# Patient Record
Sex: Female | Born: 1970 | Race: White | Hispanic: No | Marital: Married | Smoking: Never smoker
Health system: Southern US, Community
[De-identification: ages and names within clinical notes are randomized; demographics above are authoritative.]

## PROBLEM LIST (undated history)

## (undated) DIAGNOSIS — M3500 Sicca syndrome, unspecified: Secondary | ICD-10-CM

## (undated) DIAGNOSIS — R197 Diarrhea, unspecified: Secondary | ICD-10-CM

## (undated) DIAGNOSIS — K2980 Duodenitis without bleeding: Secondary | ICD-10-CM

## (undated) DIAGNOSIS — D649 Anemia, unspecified: Secondary | ICD-10-CM

## (undated) HISTORY — PX: HERNIA REPAIR: SHX51

## (undated) HISTORY — PX: APPENDECTOMY: SHX54

## (undated) HISTORY — PX: NECK SURGERY: SHX720

## (undated) HISTORY — DX: Sjogren syndrome, unspecified: M35.00

## (undated) HISTORY — DX: Duodenitis without bleeding: K29.80

## (undated) HISTORY — PX: SHOULDER ARTHROSCOPY: SHX128

## (undated) HISTORY — DX: Diarrhea, unspecified: R19.7

## (undated) HISTORY — DX: Anemia, unspecified: D64.9

## (undated) HISTORY — PX: FACIAL COSMETIC SURGERY: SHX629

---

## 2016-05-10 ENCOUNTER — Encounter: Payer: Self-pay | Admitting: Nurse Practitioner

## 2016-05-22 ENCOUNTER — Telehealth: Payer: Self-pay

## 2016-05-22 ENCOUNTER — Ambulatory Visit: Payer: Self-pay | Admitting: Nurse Practitioner

## 2016-05-22 NOTE — Telephone Encounter (Signed)
Pt has been rescheduled. 

## 2016-05-22 NOTE — Telephone Encounter (Signed)
Called to reschedule appt for today with Willette ClusterPaula Guenther due to the weather, left message.

## 2016-05-30 ENCOUNTER — Encounter: Payer: Self-pay | Admitting: Nurse Practitioner

## 2016-05-30 ENCOUNTER — Ambulatory Visit (INDEPENDENT_AMBULATORY_CARE_PROVIDER_SITE_OTHER): Payer: BLUE CROSS/BLUE SHIELD | Admitting: Nurse Practitioner

## 2016-05-30 ENCOUNTER — Other Ambulatory Visit (INDEPENDENT_AMBULATORY_CARE_PROVIDER_SITE_OTHER): Payer: BLUE CROSS/BLUE SHIELD

## 2016-05-30 VITALS — BP 98/64 | HR 70 | Ht 67.0 in | Wt 176.0 lb

## 2016-05-30 DIAGNOSIS — R109 Unspecified abdominal pain: Secondary | ICD-10-CM | POA: Diagnosis not present

## 2016-05-30 DIAGNOSIS — R110 Nausea: Secondary | ICD-10-CM

## 2016-05-30 LAB — CBC
HCT: 41.1 % (ref 36.0–46.0)
Hemoglobin: 13.6 g/dL (ref 12.0–15.0)
MCHC: 33.2 g/dL (ref 30.0–36.0)
MCV: 98.5 fl (ref 78.0–100.0)
Platelets: 253 10*3/uL (ref 150.0–400.0)
RBC: 4.17 Mil/uL (ref 3.87–5.11)
RDW: 13.2 % (ref 11.5–15.5)
WBC: 8.9 10*3/uL (ref 4.0–10.5)

## 2016-05-30 LAB — COMPREHENSIVE METABOLIC PANEL
ALBUMIN: 4.3 g/dL (ref 3.5–5.2)
ALK PHOS: 35 U/L — AB (ref 39–117)
ALT: 12 U/L (ref 0–35)
AST: 18 U/L (ref 0–37)
BILIRUBIN TOTAL: 0.6 mg/dL (ref 0.2–1.2)
BUN: 10 mg/dL (ref 6–23)
CALCIUM: 9.6 mg/dL (ref 8.4–10.5)
CO2: 27 mEq/L (ref 19–32)
CREATININE: 0.83 mg/dL (ref 0.40–1.20)
Chloride: 103 mEq/L (ref 96–112)
GFR: 78.33 mL/min (ref >60.00)
Glucose, Bld: 91 mg/dL (ref 70–99)
Potassium: 3.9 mEq/L (ref 3.5–5.1)
Sodium: 137 mEq/L (ref 135–145)
TOTAL PROTEIN: 7.4 g/dL (ref 6.0–8.3)

## 2016-05-30 LAB — LIPASE: LIPASE: 20 U/L (ref 11.0–59.0)

## 2016-05-30 NOTE — Patient Instructions (Signed)
Your physician has requested that you go to the basement for lab work before leaving today.  You have been scheduled for an endoscopy. Please follow written instructions given to you at your visit today. If you use inhalers (even only as needed), please bring them with you on the day of your procedure. Your physician has requested that you go to www.startemmi.com and enter the access code given to you at your visit today. This web site gives a general overview about your procedure. However, you should still follow specific instructions given to you by our office regarding your preparation for the procedure.   

## 2016-05-31 NOTE — Progress Notes (Signed)
HPI:  Patient is a 46 yo female referred here for evaluation of left sided abdominal pain. She was a former patient of Dr. Aleene DavidsonSpainhour in [place] who she saw in 2012 for upper abdominal pain. I reviewed the records and at the time she had postprandial epigastric pain radiating through to her back.  She was tried on PPI and hyoscymaine. The hyoscymine helped but caused headaches. EGD  revealed scattered edema in second portion of duodenum, biopsies c/w mild chronic duodenitis. CTscan and ultrasound were normal. HIDA showed EF of 91%. She eventually had a cholecystectomy.   Charlene Barrera is here with a month of left mid abdominal pain radiating through to her back and associated with nausea. Pain is mainly postprandial and can last several areas.Fatty foods cause the stabbing pain, bland foods lead to a "twinge" of pain. Pepto helps nausea but not the pain. In January she to several ibuprofen for cyst in ear, otherwise no NSAID use. Her weight is down a few pounds due to dietary modifications to deal with abdominal pain  Charlene Barrera also mentions that her stool is often a tan color. Sometimes stools are yellow and loose. No blood.    Past Medical History:  Diagnosis Date  . Anemia   . Duodenitis   . Sjogren's disease [facility](HCC)     Past Surgical History:  Procedure Laterality Date  . APPENDECTOMY    . CESAREAN SECTION    . FACIAL COSMETIC SURGERY    . HERNIA REPAIR    . SHOULDER ARTHROSCOPY     Family History  Problem Relation Age of Onset  . Breast cancer Mother   . Breast cancer Paternal Aunt    Social History  Substance Use Topics  . Smoking status: Never Smoker  . Smokeless tobacco: Never Used  . Alcohol use Yes     Comment: occasional   Current Outpatient Prescriptions  Medication Sig Dispense Refill  . hydroxychloroquine (PLAQUENIL) 200 MG tablet Take by mouth 2 (two) times daily.     No current facility-administered medications for this visit.    Allergies  Allergen Reactions  .  Wheat Bran   . Yeast-Related Products      Review of Systems: Positive for fatigue, fever, headaches, iching, sleeping problems, and excessive thirst. All other systems reviewed and negative except where noted in HPI.    Physical Exam: BP 98/64   Pulse 70   Ht 5\' 7"  (1.702 m)   Wt 176 lb (79.8 kg)   LMP 05/12/2016   BMI 27.57 kg/m  Constitutional:  Well-developed, white female in no acute distress. Psychiatric: Normal mood and affect. Behavior is normal. HEENT: Normocephalic and atraumatic. Conjunctivae are normal. No scleral icterus. Neck supple.  Cardiovascular: Normal rate, regular rhythm.  Pulmonary/chest: Effort normal and breath sounds normal. No wheezing, rales or rhonchi. Abdominal: Soft, nondistended, nontender. Bowel sounds active throughout. There are no masses palpable. No hepatomegaly. Extremities: no edema Lymphadenopathy: No cervical adenopathy noted. Neurological: Alert and oriented to person place and time. Skin: Skin is warm and dry. No rashes noted.   ASSESSMENT AND PLAN:  1. 46 yo female with a one month history of left mid abdominal pain radiating through to back. Pain mainly postprandial, worse with fatty food and usually accompanied by nausea. She complains of tan / beige stools as well. Reviewed 2012 records from GI in [place] when patient had nausea and postprandial upper abdominal pain radiating through to her back. Treated with PPI at time but ulimately had  cholecystectomy, pain resolved. EGD with biopsies back then revealed chronic duodenitis. Etiology of her current symptoms unclear at this point.  -Pain left mid lateral so PUD seems unlikely. She does have associated nausea however so it is reasonable to start with EGD. The risks and benefits of EGD were discussed and the patient agrees to proceed.  -CBC, CMET, lipase today -further recommendations following above results.   2. Sjogren's . On Plaquenil.    Willette Cluster, NP  05/31/2016, 11:30  AM

## 2016-06-01 NOTE — Progress Notes (Signed)
Thank you for sending this case to me. I have reviewed the entire note, and the outlined plan seems appropriate.   Henry Danis, MD  

## 2016-06-05 ENCOUNTER — Ambulatory Visit (AMBULATORY_SURGERY_CENTER): Payer: BLUE CROSS/BLUE SHIELD | Admitting: Gastroenterology

## 2016-06-05 ENCOUNTER — Encounter: Payer: Self-pay | Admitting: Gastroenterology

## 2016-06-05 VITALS — BP 100/54 | HR 74 | Temp 98.9°F | Resp 16 | Ht 67.0 in | Wt 176.0 lb

## 2016-06-05 DIAGNOSIS — R197 Diarrhea, unspecified: Secondary | ICD-10-CM | POA: Diagnosis not present

## 2016-06-05 DIAGNOSIS — R110 Nausea: Secondary | ICD-10-CM

## 2016-06-05 DIAGNOSIS — R1013 Epigastric pain: Secondary | ICD-10-CM | POA: Diagnosis present

## 2016-06-05 MED ORDER — SODIUM CHLORIDE 0.9 % IV SOLN: 500.0000 mL | INTRAVENOUS | Status: DC

## 2016-06-05 NOTE — Patient Instructions (Signed)
  Exam was normal.  Biopsies taken for evaluation of celiac disease.  Await results for final recommendations.    YOU HAD AN ENDOSCOPIC PROCEDURE TODAY AT THE [facility]:   Refer to the procedure report that was given to you for any specific questions about what was found during the examination.  If the procedure report does not answer your questions, please call your gastroenterologist to clarify.  If you requested that your care partner not be given the details of your procedure findings, then the procedure report has been included in a sealed envelope for you to review at your convenience later.  YOU SHOULD EXPECT: Some feelings of bloating in the abdomen. Passage of more gas than usual.  Walking can help get rid of the air that was put into your GI tract during the procedure and reduce the bloating. If you had a lower endoscopy (such as a colonoscopy or flexible sigmoidoscopy) you may notice spotting of blood in your stool or on the toilet paper. If you underwent a bowel prep for your procedure, you may not have a normal bowel movement for a few days.  Please Note:  You might notice some irritation and congestion in your nose or some drainage.  This is from the oxygen used during your procedure.  There is no need for concern and it should clear up in a day or so.  SYMPTOMS TO REPORT IMMEDIATELY:    Following upper endoscopy (EGD)  Vomiting of blood or coffee ground material  New chest pain or pain under the shoulder blades  Painful or persistently difficult swallowing  New shortness of breath  Fever of 100F or higher  Black, tarry-looking stools  For urgent or emergent issues, a gastroenterologist can be reached at any hour by calling [phone].   DIET:  We do recommend a small meal at first, but then you may proceed to your regular diet.  Drink plenty of fluids but you should avoid alcoholic beverages for 24 hours.  ACTIVITY:  You should plan to take it easy for the  rest of today and you should NOT DRIVE or use heavy machinery until tomorrow (because of the sedation medicines used during the test).    FOLLOW UP: Our staff will call the number listed on your records the next business day following your procedure to check on you and address any questions or concerns that you may have regarding the information given to you following your procedure. If we do not reach you, we will leave a message.  However, if you are feeling well and you are not experiencing any problems, there is no need to return our call.  We will assume that you have returned to your regular daily activities without incident.  If any biopsies were taken you will be contacted by phone or by letter within the next 1-3 weeks.  Please call us at [phone] if you have not heard about the biopsies in 3 weeks.    SIGNATURES/CONFIDENTIALITY: You and/or your care partner have signed paperwork which will be entered into your electronic medical record.  These signatures attest to the fact that that the information above on your After Visit Summary has been reviewed and is understood.  Full responsibility of the confidentiality of this discharge information lies with you and/or your care-partner.

## 2016-06-05 NOTE — Progress Notes (Signed)
Called to room to assist during endoscopic procedure.  Patient ID and intended procedure confirmed with present staff. Received instructions for my participation in the procedure from the performing physician.  

## 2016-06-05 NOTE — Op Note (Signed)
[facility] Patient Name: Charlene Barrera Procedure Date: 06/05/2016 10:01 AM MRN: [id number] Endoscopist: Sherilyn Cooter L. Myrtie Neither , MD Age: 46 Referring MD:  Date of Birth: [birth year] Gender: Female Account #: [id number] Procedure:                Upper GI endoscopy Indications:              Epigastric abdominal pain, Periumbilical abdominal                            pain, Diarrhea Medicines:                Monitored Anesthesia Care Procedure:                Pre-Anesthesia Assessment:                           - Prior to the procedure, a History and Physical                            was performed, and patient medications and                            allergies were reviewed. The patient's tolerance of                            previous anesthesia was also reviewed. The risks                            and benefits of the procedure and the sedation                            options and risks were discussed with the patient.                            All questions were answered, and informed consent                            was obtained. Prior Anticoagulants: The patient has                            taken no previous anticoagulant or antiplatelet                            agents. ASA Grade Assessment: I - A normal, healthy                            patient. After reviewing the risks and benefits,                            the patient was deemed in satisfactory condition to                            undergo the procedure.  After obtaining informed consent, the endoscope was                            passed under direct vision. Throughout the                            procedure, the patient's blood pressure, pulse, and                            oxygen saturations were monitored continuously. The                            Model GIF-HQ190 [id number]) scope was introduced                            through the mouth, and advanced to the second part                             of duodenum. The upper GI endoscopy was                            accomplished without difficulty. The patient                            tolerated the procedure well. Scope In: Scope Out: Findings:                 The esophagus was normal.                           The stomach was normal.                           The cardia and gastric fundus were normal on                            retroflexion.                           The examined duodenum was normal. Biopsies for                            histology were taken with a cold forceps for                            evaluation of celiac disease. Complications:            No immediate complications. Estimated Blood Loss:     Estimated blood loss: none. Impression:               - Normal esophagus.                           - Normal stomach.                           - Normal examined duodenum. Biopsied. Recommendation:           -  Patient has a contact number available for                            emergencies. The signs and symptoms of potential                            delayed complications were discussed with the                            patient. Return to normal activities tomorrow.                            Written discharge instructions were provided to the                            patient.                           - Resume previous diet.                           - Continue present medications.                           - Await pathology results.                           - Return to my office, to be arranged after biopsy                            results. Henry L. Myrtie Neitheranis, MD 06/05/2016 10:16:48 AM This report has been signed electronically.

## 2016-06-05 NOTE — Progress Notes (Signed)
Report given to PACU, vss 

## 2016-06-06 ENCOUNTER — Telehealth: Payer: Self-pay

## 2016-06-06 NOTE — Telephone Encounter (Signed)
  Follow up Call-  Call back number 06/05/2016  Post procedure Call Back phone  # [phone]  Permission to leave phone message Yes  Some recent data might be hidden    Patient was called for follow up after her procedure on 06/05/16.  No answer at the number given for follow up phone call. A message was left on voice mail.

## 2016-06-06 NOTE — Telephone Encounter (Signed)
  Follow up Call-  Call back number 06/05/2016  Post procedure Call Back phone  # [phone]  Permission to leave phone message Yes  Some recent data might be hidden     Patient questions:  Do you have a fever, pain , or abdominal swelling? No. Pain Score  0 *  Have you tolerated food without any problems? Yes.    Have you been able to return to your normal activities? Yes.    Do you have any questions about your discharge instructions: Diet   No. Medications  No. Follow up visit  No.  Do you have questions or concerns about your Care? Yes.   ** Patient said that she had several episodes of diarrhea yesterday following her procedure and was told that if it continued for several days to give us a call  Actions: * If pain score is 4 or above: No action needed, pain <4.

## 2016-07-10 ENCOUNTER — Encounter: Payer: Self-pay | Admitting: Gastroenterology

## 2016-07-10 ENCOUNTER — Ambulatory Visit (INDEPENDENT_AMBULATORY_CARE_PROVIDER_SITE_OTHER): Payer: BLUE CROSS/BLUE SHIELD | Admitting: Gastroenterology

## 2016-07-10 VITALS — BP 90/56 | HR 80 | Ht 67.0 in | Wt 179.0 lb

## 2016-07-10 DIAGNOSIS — R1012 Left upper quadrant pain: Secondary | ICD-10-CM

## 2016-07-10 NOTE — Patient Instructions (Signed)
If you are age 47 or older, your body mass index should be between 23-30. Your Body mass index is 28.04 kg/m. If this is out of the aforementioned range listed, please consider follow up with your Primary Care Provider.  If you are age 65 or younger, your body mass index should be between 19-25. Your Body mass index is 28.04 kg/m. If this is out of the aformentioned range listed, please consider follow up with your Primary Care Provider.   You have been scheduled for a CT scan of the abdomen and pelvis at [facility] ([address]---this is in the same building as [facility]).   You are scheduled on 07-18-2016 at [facility] should arrive 15 minutes prior to your appointment time for registration. Please follow the written instructions below on the day of your exam:  WARNING: IF YOU ARE ALLERGIC TO IODINE/X-RAY DYE, PLEASE NOTIFY RADIOLOGY IMMEDIATELY AT [phone]! YOU WILL BE GIVEN A 13 HOUR PREMEDICATION PREP.  1) Do not eat or drink anything after 6am (4 hours prior to your test) 2) You have been given 2 bottles of oral contrast to drink. The solution may taste better if refrigerated, but do NOT add ice or any other liquid to this solution. Shake well before drinking.    Drink 1 bottle of contrast @ 8am (2 hours prior to your exam)  Drink 1 bottle of contrast @ 9am (1 hour prior to your exam)  You may take any medications as prescribed with a small amount of water except for the following: Metformin, Glucophage, Glucovance, Avandamet, Riomet, Fortamet, Actoplus Met, Janumet, Glumetza or Metaglip. The above medications must be held the day of the exam AND 48 hours after the exam.  The purpose of you drinking the oral contrast is to aid in the visualization of your intestinal tract. The contrast solution may cause some diarrhea. Before your exam is started, you will be given a small amount of fluid to drink. Depending on your individual set of symptoms, you may also  receive an intravenous injection of x-ray contrast/dye. Plan on being at [facility] for 30 minutes or longer, depending on the type of exam you are having performed.  This test typically takes 30-45 minutes to complete.  If you have any questions regarding your exam or if you need to reschedule, you may call the CT department at [phone] between the hours of 8:00 am and 5:00 pm, Monday-Friday.  ________________________________________________________________________   Thank you for choosing [facility]  Dr Wilfrid Lund III

## 2016-07-10 NOTE — Progress Notes (Signed)
     [facility] Progress Note  Chief Complaint: Left upper quadrant pain  Subjective  History:  Charlene Barrera follows up for her abdominal pain after recent EGD. See our NP office note of 05/30/2016 for details. It is a left-sided crampy and sometimes sharp or stabbing pain that might be felt in the back as well. He usually occurs within about 30 minutes of eating. That many food intolerances. Her stool tends to be yellow ever since her cholecystectomy in 2012. She denies rectal bleeding or weight loss. She has had a cholecystectomy, appendectomy and C-section. Of note, hyoscyamine helped these symptoms 5 years ago during her workup in [place], but she says she could not tolerate the headaches and caused. She also has Sjogren's syndrome, and therefore is prone to get dry eyes and mouth. Anticholinergic medicines would make this worse.  ROS: Cardiovascular:  no chest pain Respiratory: no dyspnea  The patient's Past Medical, Family and Social History were reviewed and are on file in the EMR.  Objective:  Med list reviewed  Vital signs in last 24 hrs: Vitals:   07/10/16 1531  BP: (!) 90/56  Pulse: 80    Physical Exam  She is well-appearing  HEENT: sclera anicteric, oral mucosa moist without lesions  Neck: supple, no thyromegaly, JVD or lymphadenopathy  Cardiac: RRR without murmurs, S1S2 heard, no peripheral edema  Pulm: clear to auscultation bilaterally, normal RR and effort noted  Abdomen: soft, mild LUQ tenderness to deep palpation, with active bowel sounds. No guarding or palpable hepatosplenomegaly.  Skin; warm and dry, no jaundice or rash  Duodenal biopsies normal  @ Assessment: Encounter Diagnosis  Name Primary?  . 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 She reports many food intolerances including gluten, this in spite of a normal small bowel biopsy. She was told by an allergist years ago  she had an allergy to both gluten and [place]. Plan: CT scan abdomen and pelvis to be sure there is no more worrisome abdominal or pelvic pathology causing this pain. It sounds like she had relief of symptoms after cholecystectomy years ago, and the pain now is in somewhat different location and character than what she had before.  I recommended she investigate some alternative therapies such as essentially oils. FD gard is also available OTC. Total time 25 minutes, over half spent in counseling and coordination of care.   Charlie Pitter III

## 2016-07-17 ENCOUNTER — Telehealth: Payer: Self-pay

## 2016-07-17 NOTE — Telephone Encounter (Signed)
-----   Message from Libby MawAmy L Hazelwood sent at 07/14/2016 12:03 PM EDT ----- Regarding: CT Denied Roxana HiresHey Julie, This pt's CT was denied.  They said that an ultrasound should be performed prior and may help with diagnosis.  Dr. Myrtie Neitheranis has 14 calendar days to call back for peer to peer.  Office notes were sent to them.  I know he is not in the office until Tuesday and her appt is on Tuesday. So you may have to cancel appt until he returns. Lemme know, Amy

## 2016-07-17 NOTE — Telephone Encounter (Signed)
Spoke to patient, let her know that Dr. Myrtie Neitheranis will call for the peer to peer when he has time available. Told patient that I would call her back to let her know what results from that phone call. She is aware that CT is cancelled for tomorrow, and that Dr. Myrtie Neitheranis does not think US would give him the best results, therefore will not be ordering this.

## 2016-07-17 NOTE — Telephone Encounter (Signed)
Yes, I rec'd the message about this late Friday.  I have just not yet had time to call for peer review.  I agree that best plan is to cancel the CT for tomorrow b/c not urgent.  I feel an ultrasound for this purpose would be a waste of time and money, as it would not likely be able to see the things we are looking for.  I will call peer review when I am able.

## 2016-07-17 NOTE — Telephone Encounter (Signed)
Left a detailed message for patient that I have cancelled her CT, as her insurance will not pay for this. I have sent this message to Dr. Myrtie Neitheranis to see if he wants to order something else for her. Asked that patient call back to confirm she received my message.

## 2016-07-18 ENCOUNTER — Other Ambulatory Visit: Payer: BLUE CROSS/BLUE SHIELD

## 2016-07-18 NOTE — Telephone Encounter (Signed)
I reached the company that handles prior auth /peer-to-peer for her insurance company.  After a long hold I reached someone who told me I had to schedule a future peer-to-peer physician review.  They do not do them when you call, they require at least 24 hrs notice for an appointment.  So even though I called when I was between office patients, they will only work by appointment that fits their schedule.  I asked them to have the physician reviewer call my mobile number over my (hopeful) lunch break after AM procedures tomorrow.  I will let you know the outcome.

## 2016-07-20 NOTE — Telephone Encounter (Signed)
Called patient let her know that CT has been authorized. Gave her the number to [facility] to reschedule her appointment, confirmed that patient has the oral contrast. Told her that once Dr. Myrtie Neitheranis has had a chance to look at the report we would contact her with results.

## 2016-07-20 NOTE — Telephone Encounter (Signed)
I spoke to Dr Memory Danceevenney, who authorized the CT scan abdomen/pelvis with IV contrast.  Authorization code: [id number], good until 10/12/16  Please contact patient to let her know and to reschedule CT scan.  We will call her when we have a report.

## 2016-07-26 ENCOUNTER — Ambulatory Visit (INDEPENDENT_AMBULATORY_CARE_PROVIDER_SITE_OTHER)
Admission: RE | Admit: 2016-07-26 | Discharge: 2016-07-26 | Disposition: A | Discharge status: Home / Self Care | Payer: BLUE CROSS/BLUE SHIELD | Source: Ambulatory Visit | Attending: Gastroenterology | Admitting: Gastroenterology

## 2016-07-26 DIAGNOSIS — R1012 Left upper quadrant pain: Secondary | ICD-10-CM

## 2016-07-26 MED ORDER — IOPAMIDOL (ISOVUE-300) INJECTION 61%: 100.0000 mL | Freq: Once | INTRAVENOUS | Status: DC | PRN

## 2017-02-12 ENCOUNTER — Encounter: Payer: Self-pay | Admitting: Neurology

## 2017-02-12 ENCOUNTER — Ambulatory Visit (INDEPENDENT_AMBULATORY_CARE_PROVIDER_SITE_OTHER): Payer: BLUE CROSS/BLUE SHIELD | Admitting: Neurology

## 2017-02-12 VITALS — BP 109/69 | HR 75 | Ht 67.0 in | Wt 179.0 lb

## 2017-02-12 DIAGNOSIS — R209 Unspecified disturbances of skin sensation: Secondary | ICD-10-CM

## 2017-02-12 DIAGNOSIS — IMO0001 Reserved for inherently not codable concepts without codable children: Secondary | ICD-10-CM

## 2017-02-12 DIAGNOSIS — R202 Paresthesia of skin: Secondary | ICD-10-CM

## 2017-02-12 DIAGNOSIS — G4489 Other headache syndrome: Secondary | ICD-10-CM

## 2017-02-12 DIAGNOSIS — R51 Headache: Secondary | ICD-10-CM | POA: Diagnosis not present

## 2017-02-12 DIAGNOSIS — R519 Headache, unspecified: Secondary | ICD-10-CM

## 2017-02-12 DIAGNOSIS — R200 Anesthesia of skin: Secondary | ICD-10-CM | POA: Diagnosis not present

## 2017-02-12 DIAGNOSIS — R29810 Facial weakness: Secondary | ICD-10-CM | POA: Diagnosis not present

## 2017-02-12 NOTE — Patient Instructions (Addendum)
I would like to investigate things further to look for evidence of neuropathy or nerve damage; therefore, I would like to do some blood work, and electrical testing of your muscles and nerves, which is known as EMG/NCV. Neuropathy or nerve disease or damage can be caused by a variety of causes, most commonly diabetes, some toxins including alcohol or metabolic derangements or hereditary disorders, or rheumatological and autoimmune diseases.  We will do a brain scan, called MRI and call you with the test results. We will have to schedule you for this on a separate date. This test requires authorization from your insurance, and we will take care of the insurance process.  We will do an EMG and nerve conduction velocity test, which is an electrical nerve and muscle test, which we will schedule. We will call you with the results.  We will check blood work today and call you with the test results.

## 2017-02-12 NOTE — Progress Notes (Signed)
Subjective:    Patient ID: Charlene Barrera is a 46 y.o. female.  HPI     Star Age, MD, PhD [facility] [address], [address] [place], [place] [address]  Dear Cleatis Polka,   I saw your patient, Charlene Barrera, upon your kind request in my neurologic clinic today for initial consultation of her facial numbness and tingling. Patient is unaccompanied today. As you know, [name] is a 46 year old right-handed woman with an underlying medical history of Raynaud's syndrome, sicca syndrome, arthralgia, vitamin D deficiency, IBS, and overweight state, who reports intermittent numbness and tingling in her upper lip, also right-sided thumb and forefinger and left thumb. She has had some recurrent headaches, mostly starting in the posterior area, feels like tension and her neck muscles and base of the skull. Light massaging helps but deep massaging causes her to feel dizzy. She has no history of diabetes or gestational diabetes. She has 3 grown children, 14 year old daughter and twin boys age 73. 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 She does not endorse any migrainous headaches. She is a nonsmoker and drinks alcohol infrequently, maybe twice a month or so and reports multiple food intolerances and allergies. She has been on vitamin D supplementation per GI recommendation. She drinks caffeine in the form of coffee, usually 2 cups per day, hydrates well with water. 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 She has never been on methotrexate and has been on Platinol for about a year, in the process of reducing the dose. I reviewed your office note from 11/21/2016. She had blood work through your office including complement C3, normal, CRP normal, complement C4 normal, ESR 0, ANA negative. She does not report any actual  weakness, no pain. She had some tingling in both thighs the other day that felt like pins and needles, nothing sustained, her symptoms come and go and there is no telltale trigger.  Her Past Medical History Is Significant For: Past Medical History:  Diagnosis Date  . Anemia   . Diarrhea   . Duodenitis   . Sjogren's disease ([place])     Her Past Surgical History Is Significant For: Past Surgical History:  Procedure Laterality Date  . APPENDECTOMY    . CESAREAN SECTION    . FACIAL COSMETIC SURGERY    . HERNIA REPAIR    . SHOULDER ARTHROSCOPY      Her Family History Is Significant For: Family History  Problem Relation Age of Onset  . Breast cancer Mother   . Breast cancer Paternal Aunt     Her Social History Is Significant For: Social History   Socioeconomic History  . Marital status: Married    Spouse name: None  . Number of children: None  . Years of education: None  . Highest education level: None  Social Needs  . Financial resource strain: None  . Food insecurity - worry: None  . Food insecurity - inability: None  . Transportation needs - medical: None  . Transportation needs - non-medical: None  Occupational History  . None  Tobacco Use  . Smoking status: Never Smoker  . Smokeless tobacco: Never Used  Substance and Sexual Activity  . Alcohol use: Yes    Comment: occasional  . Drug use: No  . Sexual activity: None  Other Topics Concern  .  None  Social History Narrative  . None    Her Allergies Are:  Allergies  Allergen Reactions  . Wheat Bran   . Yeast-Related Products   :   Her Current Medications Are:  Outpatient Encounter Medications as of 02/12/2017  Medication Sig  . Cholecalciferol (VITAMIN D) 2000 units tablet Take by mouth.  . hydroxychloroquine (PLAQUENIL) 200 MG tablet Take by mouth 2 (two) times daily.  Marland Kitchen Lifitegrast (XIIDRA) 5 % SOLN Apply 1 drop to eye daily.  . Multiple Vitamin (MULTI-VITAMINS) TABS Take by mouth.  . Probiotic  Product (PROBIOTIC ADVANCED PO) Take by mouth.   Facility-Administered Encounter Medications as of 02/12/2017  Medication  . 0.9 %  sodium chloride infusion  : Review of Systems:  Out of a complete 14 point review of systems, all are reviewed and negative with the exception of these symptoms as listed below: Review of Systems  Neurological:       Patient here to discuss numbness in her fingers, arms, and upper lip. These symptoms started around September 2018. She also has daily headaches.     Objective:  Neurological Exam  Physical Exam Physical Examination:   Vitals:   02/12/17 1418  BP: 109/69  Pulse: 75   General Examination: The patient is a very pleasant 46 y.o. female in no acute distress. She appears well-developed and well-nourished and well groomed.   HEENT: Normocephalic, atraumatic, pupils are equal, round and reactive to light and accommodation. Funduscopic exam is normal with sharp disc margins noted. Extraocular tracking is good without limitation to gaze excursion or nystagmus noted. Normal smooth pursuit is noted. Hearing is grossly intact. Face is slightly asymmetric with right more than left facial weakness noted. This is not new per patient. She has normal facial sensation. Speech is clear with no dysarthria noted. There is no hypophonia. There is no lip, neck/head, jaw or voice tremor. Neck is supple with full range of passive and active motion. There are no carotid bruits on auscultation. Oropharynx exam reveals: mild mouth dryness, no abnormality.   Chest: Clear to auscultation without wheezing, rhonchi or crackles noted.  Heart: S1+S2+0, regular and normal without murmurs, rubs or gallops noted.   Abdomen: Soft, non-tender and non-distended with normal bowel sounds appreciated on auscultation.  Extremities: There is no pitting edema in the distal lower extremities bilaterally. Pedal pulses are intact.  Skin: Warm and dry without trophic changes  noted.  Musculoskeletal: exam reveals no obvious joint deformities, tenderness or joint swelling or erythema.   Neurologically:  Mental status: The patient is awake, alert and oriented in all 4 spheres. Her immediate and remote memory, attention, language skills and fund of knowledge are appropriate. There is no evidence of aphasia, agnosia, apraxia or anomia. Speech is clear with normal prosody and enunciation. Thought process is linear. Mood is normal and affect is normal.  Cranial nerves II - XII are as described above under HEENT exam. In addition: shoulder shrug is normal with equal shoulder height noted. Motor exam: Normal bulk, strength and tone is noted. There is no drift, tremor or rebound. Romberg is negative. Reflexes are 2+ throughout. Babinski: Toes are flexor bilaterally. Fine motor skills and coordination: intact with normal finger taps, normal hand movements, normal rapid alternating patting, normal foot taps and normal foot agility.  Cerebellar testing: No dysmetria or intention tremor on finger to nose testing. Heel to shin is unremarkable bilaterally. There is no truncal or gait ataxia.  Sensory exam: intact to light touch, pinprick,  vibration, temperature sense in the upper and lower extremities.  Gait, station and balance: She stands easily. No veering to one side is noted. No leaning to one side is noted. Posture is age-appropriate and stance is narrow based. Gait shows normal stride length and normal pace. No problems turning are noted. Tandem walk is unremarkable.   Assessment and Plan:   In summary, Katrece Roediger is a very pleasant 46 y.o.-year old female with an underlying medical history of Raynaud's syndrome, sicca syndrome, arthralgia, vitamin D deficiency, IBS, and overweight state, who presents for neurologic consultation of her intermittent paresthesias affecting her face, mostly her lip area, hands and lower extremities, particularly her thighs interestingly. She is at  risk for neuropathy given her underlying rheumatological illness, nevertheless her exam does not suggest widespread neuropathy and it is reassuring. She is advised that I would like to proceed with further testing in the form of blood work, EMG and nerve conduction testing and we will also add a brain MRI with and without contrast given her history of recurrent headaches. She is in agreement, I did not suggest any new medications at this time and she is in the process of weaning down the Plaquenil. We will keep her posted as to her test results. She may be a candidate for a symptomatic treatment for paresthesias such as gabapentin down the [place], we will consider this in the future. She is advised to follow-up after these tests are completed. I answered all her questions today and she was in agreement.   Thank you very much for allowing me to participate in the care of this nice patient. If I can be of any further assistance to you please do not hesitate to call me at [phone].  Sincerely,   Star Age, MD, PhD

## 2017-02-13 ENCOUNTER — Telehealth: Payer: Self-pay

## 2017-02-13 NOTE — Telephone Encounter (Signed)
-----   Message from Huston FoleySaima Athar, MD sent at 02/13/2017  5:08 PM EST ----- Please call patient and advise her that blood work so far is normal. We checked for diabetes with a screening test called hemoglobin A1c which was in the normal range, thyroid screening test with TSH which was normal, vitamin D level, vitamin B-1 and B12, iron studies, all of which were normal which is reassuring. Two test results are pending, we will update if abnormal, otherwise we won't call back. As discussed, will proceed with EMG and nerve conduction testing as well as MRI brain.

## 2017-02-13 NOTE — Telephone Encounter (Signed)
I called pt. I advised her that her labs were normal so far. There are 2 tests pending and if they are abnormal I will call the pt to update her. I encouraged her to continue with the EMG/NCV and MRI. Pt verbalized understanding of results. Pt had no questions at this time but was encouraged to call back if questions arise.

## 2017-02-13 NOTE — Progress Notes (Signed)
Please call patient and advise her that blood work so far is normal. We checked for diabetes with a screening test called hemoglobin A1c which was in the normal range, thyroid screening test with TSH which was normal, vitamin D level, vitamin B-1 and B12, iron studies, all of which were normal which is reassuring. Two test results are pending, we will update if abnormal, otherwise we won't call back. As discussed, will proceed with EMG and nerve conduction testing as well as MRI brain.

## 2017-02-14 LAB — IRON AND TIBC
IRON: 58 ug/dL (ref 27–159)
Iron Saturation: 20 % (ref 15–55)
TIBC: 294 ug/dL (ref 250–450)
UIBC: 236 ug/dL (ref 131–425)

## 2017-02-14 LAB — HTLV I+II ANTIBODIES, (EIA), BLD: HTLV I/II AB: NEGATIVE

## 2017-02-14 LAB — B12 AND FOLATE PANEL
Folate: 14 ng/mL (ref >3.0)
Vitamin B-12: 555 pg/mL (ref 232–1245)

## 2017-02-14 LAB — MULTIPLE MYELOMA PANEL, SERUM
Albumin SerPl Elph-Mcnc: 3.7 g/dL (ref 2.9–4.4)
Albumin/Glob SerPl: 1.2 (ref 0.7–1.7)
Alpha 1: 0.2 g/dL (ref 0.0–0.4)
Alpha2 Glob SerPl Elph-Mcnc: 0.6 g/dL (ref 0.4–1.0)
B-GLOBULIN SERPL ELPH-MCNC: 1 g/dL (ref 0.7–1.3)
Gamma Glob SerPl Elph-Mcnc: 1.2 g/dL (ref 0.4–1.8)
Globulin, Total: 3.1 g/dL (ref 2.2–3.9)
IGG (IMMUNOGLOBIN G), SERUM: 1284 mg/dL (ref 700–1600)
IGM (IMMUNOGLOBULIN M), SRM: 96 mg/dL (ref 26–217)
IgA/Immunoglobulin A, Serum: 462 mg/dL — ABNORMAL HIGH (ref 87–352)
TOTAL PROTEIN: 6.8 g/dL (ref 6.0–8.5)

## 2017-02-14 LAB — VITAMIN B1: Thiamine: 93 nmol/L (ref 66.5–200.0)

## 2017-02-14 LAB — HGB A1C W/O EAG: HEMOGLOBIN A1C: 4.9 % (ref 4.8–5.6)

## 2017-02-14 LAB — VITAMIN D 25 HYDROXY (VIT D DEFICIENCY, FRACTURES): Vit D, 25-Hydroxy: 55.7 ng/mL (ref 30.0–100.0)

## 2017-02-14 LAB — TSH: TSH: 1.2 u[IU]/mL (ref 0.450–4.500)

## 2017-02-14 LAB — FERRITIN: Ferritin: 49 ng/mL (ref 15–150)

## 2017-02-28 ENCOUNTER — Ambulatory Visit
Admission: RE | Admit: 2017-02-28 | Discharge: 2017-02-28 | Disposition: A | Discharge status: Home / Self Care | Payer: BLUE CROSS/BLUE SHIELD | Source: Ambulatory Visit | Attending: Neurology | Admitting: Neurology

## 2017-02-28 ENCOUNTER — Other Ambulatory Visit: Payer: Self-pay | Admitting: Neurology

## 2017-02-28 DIAGNOSIS — G4489 Other headache syndrome: Secondary | ICD-10-CM | POA: Diagnosis not present

## 2017-02-28 MED ORDER — GADOBENATE DIMEGLUMINE 529 MG/ML IV SOLN
16.0000 mL | Freq: Once | INTRAVENOUS | Status: AC | PRN
  Administered 2017-02-28: 16 mL via INTRAVENOUS

## 2017-03-01 ENCOUNTER — Ambulatory Visit (INDEPENDENT_AMBULATORY_CARE_PROVIDER_SITE_OTHER): Payer: BLUE CROSS/BLUE SHIELD | Admitting: Neurology

## 2017-03-01 ENCOUNTER — Telehealth: Payer: Self-pay

## 2017-03-01 DIAGNOSIS — R519 Headache, unspecified: Secondary | ICD-10-CM

## 2017-03-01 DIAGNOSIS — R200 Anesthesia of skin: Secondary | ICD-10-CM | POA: Diagnosis not present

## 2017-03-01 DIAGNOSIS — Z0289 Encounter for other administrative examinations: Secondary | ICD-10-CM

## 2017-03-01 DIAGNOSIS — R202 Paresthesia of skin: Secondary | ICD-10-CM | POA: Diagnosis not present

## 2017-03-01 DIAGNOSIS — R209 Unspecified disturbances of skin sensation: Principal | ICD-10-CM

## 2017-03-01 DIAGNOSIS — R29810 Facial weakness: Secondary | ICD-10-CM

## 2017-03-01 DIAGNOSIS — IMO0001 Reserved for inherently not codable concepts without codable children: Secondary | ICD-10-CM

## 2017-03-01 DIAGNOSIS — G4489 Other headache syndrome: Secondary | ICD-10-CM

## 2017-03-01 DIAGNOSIS — R51 Headache: Secondary | ICD-10-CM

## 2017-03-01 NOTE — Progress Notes (Signed)
Please call patient regarding the recent brain MRI: The brain scan showed a normal structure of the brain and no volume loss which we call atrophy. There were changes in the deeper structures of the brain, which we call white matter changes or microvascular changes. These were reported as "tiny" in Her case. These are small, or in her case tiny white spots, that occur with time and are seen in a variety of conditions, including with normal aging, chronic hypertension, chronic headaches, especially migraine HAs, chronic diabetes, chronic hyperlipidemia. These are not strokes and no mass or lesion or contrast enhancement was seen which is reassuring. Again, there were no acute findings, such as a stroke, or mass or blood products. No further action is required on this test at this time, other than re-enforcing the importance of good blood pressure control, good cholesterol control, good blood sugar control, and weight management, she has no issue with weight of course, as I recall.  Please remind patient to keep any upcoming appointments or tests and to call us with any interim questions, concerns, problems or updates. Thanks,  Huston FoleySaima Adriyana Greenbaum, MD, PhD

## 2017-03-01 NOTE — Telephone Encounter (Signed)
Pt returned my call. I advised her that her MRI showed a normal structure of the brain and no volume loss. I explained that there were changes in the deeper structures of the brain, called white matter changes, which are tiny in her case. I explained the conditions that may be seen with white matter changes. I reinforced with pt the importance of good blood pressure, good blood sugar, good cholesterol, and weight management. Pt verbalized understanding of results. Pt had no questions at this time but was encouraged to call back if questions arise.

## 2017-03-01 NOTE — Progress Notes (Signed)
See procedure note.

## 2017-03-01 NOTE — Telephone Encounter (Signed)
I called pt to discuss. No answer, left a message asking her to call me back. 

## 2017-03-01 NOTE — Progress Notes (Signed)
Full Name: Charlene BattenSusan Gervase Gender: Female MRN #: [id number] Date of Birth: Aug 11, 2070    Visit Date: 03/01/17 08:25 Age: 46 Years 8 Months Old Examining Physician: Naomie DeanAntonia Ahern, MD  Referring Physician: Huston FoleySaima Athar, MD   History: Patient is here for evaluation of paresthesias.  Summary: EMG nerve conduction studies were completed on the bilateral upper lower extremities and right lower extremity. The right median/ulnar (palm) comparison nerve showed prolonged distal peak latency (Median Palm, 2.7 ms, N<2.2) and abnormal peak latency difference (Median Palm-Ulnar Palm, 0.9 ms, N<0.4) with a relative median delay. All remaining nerves (as detailed in the following tables) were within normal limits.  All muscles (as detailed in the following tables) were within normal limits.  Conclusion: This is an essentially normal EMG nerve conduction study of the bilateral uppers and right lower extremity with the exception of possibly mild/early right carpal tunnel syndrome.  Otherwise no suggestion of polyneuropathy, myopathy or myositis, neuromuscular junction disorder, cervical or lumbar radiculopathy.  Naomie DeanAntonia Ahern M.D.  Cc: Dr. Constance HolsterAthar  [facility] [address] [place], [place] [address] Tel: [phone] Fax: [phone]        [facility]    Nerve / Sites Muscle Latency Ref. Amplitude Ref. Rel Amp Segments Distance Velocity Ref. Area    ms ms mV mV %  cm m/s m/s mVms  L Median - APB     Wrist APB 3.2 ?4.4 6.3 ?4.0 100 Wrist - APB 7   29.3     Upper arm APB 7.3  6.1  98.1 Upper arm - Wrist 22 54 ?49 30.8  R Median - APB     Wrist APB 3.3 ?4.4 6.3 ?4.0 100 Wrist - APB 7   26.7     Upper arm APB 7.6  5.9  93.8 Upper arm - Wrist 22 52 ?49 26.4  L Ulnar - ADM     Wrist ADM 2.6 ?3.3 8.5 ?6.0 100 Wrist - ADM 7   31.0     B.Elbow ADM 6.1  6.5  76.4 B.Elbow - Wrist 20 56 ?49 24.3     A.Elbow ADM 7.4  7.4  114 A.Elbow - B.Elbow 8 61 ?49 28.7         A.Elbow - Wrist      R Ulnar -  ADM     Wrist ADM 2.3 ?3.3 6.4 ?6.0 100 Wrist - ADM 7   28.0     B.Elbow ADM 5.9  5.6  88 B.Elbow - Wrist 20 56 ?49 24.1     A.Elbow ADM 7.6  5.7  101 A.Elbow - B.Elbow 10 60 ?49 23.9         A.Elbow - Wrist      R Peroneal - EDB     Ankle EDB 5.3 ?6.5 7.2 ?2.0 100 Ankle - EDB 9   33.4     Fib head EDB 11.9  7.3  100 Fib head - Ankle 31 47 ?44 36.5     Pop fossa EDB 13.9  7.1  98.3 Pop fossa - Fib head 10 51 ?44 35.2         Pop fossa - Ankle      R Tibial - AH     Ankle AH 5.7 ?5.8 14.9 ?4.0 100 Ankle - AH 9   53.5     Pop fossa AH 14.0  13.2  88.4 Pop fossa - Ankle 36 43 ?41 47.2  SNC    Nerve / Sites Rec. Site Peak Lat Ref.  Amp Ref. Segments Distance Peak Diff Ref.    ms ms V V  cm ms ms  L Radial - Anatomical snuff box (Forearm)     Forearm Wrist 2.7 ?2.9 24 ?15 Forearm - Wrist 10    R Radial - Anatomical snuff box (Forearm)     Forearm Wrist 2.6 ?2.9 19 ?15 Forearm - Wrist 10    R Sural - Ankle (Calf)     Calf Ankle 3.8 ?4.4 10 ?6 Calf - Ankle 14    R Superficial peroneal - Ankle     Lat leg Ankle 3.9 ?4.4 21 ?6 Lat leg - Ankle 14    L Median, Ulnar - Transcarpal comparison     Median Palm Wrist 2.0 ?2.2 65 ?35 Median Palm - Wrist 8       Ulnar Palm Wrist 1.9 ?2.2 25 ?12 Ulnar Palm - Wrist 8          Median Palm - Ulnar Palm  0.1 ?0.4  R Median, Ulnar - Transcarpal comparison     Median Palm Wrist 2.7 ?2.2 33 ?35 Median Palm - Wrist 8       Ulnar Palm Wrist 1.8 ?2.2 14 ?12 Ulnar Palm - Wrist 8          Median Palm - Ulnar Palm  0.9 ?0.4  L Median - Orthodromic (Dig II, Mid palm)     Dig II Wrist 3.0 ?3.4 16 ?10 Dig II - Wrist 13    R Median - Orthodromic (Dig II, Mid palm)     Dig II Wrist 3.3 ?3.4 26 ?10 Dig II - Wrist 13    L Ulnar - Orthodromic, (Dig V, Mid palm)     Dig V Wrist 2.8 ?3.1 13 ?5 Dig V - Wrist 11    R Ulnar - Orthodromic, (Dig V, Mid palm)     Dig V Wrist 2.4 ?3.1 15 ?5 Dig V - Wrist 4711                             F  Wave    Nerve  F Lat Ref.   ms ms  L Ulnar - ADM 26.9 ?32.0  R Ulnar - ADM 26.0 ?32.0  R Tibial - AH 50.5 ?56.0           EMG full       EMG Summary Table    Spontaneous MUAP Recruitment  Muscle IA Fib PSW Fasc Other Amp Dur. Poly Pattern  R. Deltoid Normal None None None _______ Normal Normal Normal Normal  R. Triceps brachii Normal None None None _______ Normal Normal Normal Normal  R. Pronator teres Normal None None None _______ Normal Normal Normal Normal  R. First dorsal interosseous Normal None None None _______ Normal Normal Normal Normal  R. Opponens pollicis Normal None None None _______ Normal Normal Normal Normal  R. Cervical paraspinals (low) Normal None None None _______ Normal Normal Normal Normal  R. Lumbar paraspinals (low) Normal None None None _______ Normal Normal Normal Normal  R. Tibialis anterior Normal None None None _______ Normal Normal Normal Normal  R. Gastrocnemius (Medial head) Normal None None None _______ Normal Normal Normal Normal  R. Vastus medialis Normal None None None _______ Normal Normal Normal Normal  R. Iliopsoas Normal None None None _______ Normal Normal Normal Normal  R. Biceps femoris (long head)  Normal None None None _______ Normal Normal Normal Normal

## 2017-03-01 NOTE — Telephone Encounter (Signed)
-----   Message from Huston FoleySaima Athar, MD sent at 03/01/2017  7:45 AM EST ----- Please call patient regarding the recent brain MRI: The brain scan showed a normal structure of the brain and no volume loss which we call atrophy. There were changes in the deeper structures of the brain, which we call white matter changes or microvascular changes. These were reported as "tiny" in Her case. These are small, or in her case tiny white spots, that occur with time and are seen in a variety of conditions, including with normal aging, chronic hypertension, chronic headaches, especially migraine HAs, chronic diabetes, chronic hyperlipidemia. These are not strokes and no mass or lesion or contrast enhancement was seen which is reassuring. Again, there were no acute findings, such as a stroke, or mass or blood products. No further action is required on this test at this time, other than re-enforcing the importance of good blood pressure control, good cholesterol control, good blood sugar control, and weight management, she has no issue with weight of course, as I recall.  Please remind patient to keep any upcoming appointments or tests and to call us with any interim questions, concerns, problems or updates. Thanks,  Huston FoleySaima Athar, MD, PhD

## 2017-03-07 NOTE — Procedures (Signed)
Full Name: Charlene Barrera Gender: Female MRN #: [id number] Date of Birth: Aug 11, 2070    Visit Date: 03/01/17 08:25 Age: 46 Years 8 Months Old Examining Physician: Naomie DeanAntonia Devan Danzer, MD  Referring Physician: Huston FoleySaima Athar, MD   History: Patient is here for evaluation of paresthesias.  Summary: EMG nerve conduction studies were completed on the bilateral upper lower extremities and right lower extremity. The right median/ulnar (palm) comparison nerve showed prolonged distal peak latency (Median Palm, 2.7 ms, N<2.2) and abnormal peak latency difference (Median Palm-Ulnar Palm, 0.9 ms, N<0.4) with a relative median delay. All remaining nerves (as detailed in the following tables) were within normal limits.  All muscles (as detailed in the following tables) were within normal limits.  Conclusion: This is an essentially normal EMG nerve conduction study of the bilateral uppers and right lower extremity with the exception of possibly mild/early right carpal tunnel syndrome.  Otherwise no suggestion of polyneuropathy, myopathy or myositis, neuromuscular junction disorder, cervical or lumbar radiculopathy.  Naomie DeanAntonia Zoei Amison M.D.  Cc: Dr. Constance HolsterAthar  [facility] [address] [place], [place] [address] Tel: [phone] Fax: [phone]        [facility]    Nerve / Sites Muscle Latency Ref. Amplitude Ref. Rel Amp Segments Distance Velocity Ref. Area    ms ms mV mV %  cm m/s m/s mVms  L Median - APB     Wrist APB 3.2 ?4.4 6.3 ?4.0 100 Wrist - APB 7   29.3     Upper arm APB 7.3  6.1  98.1 Upper arm - Wrist 22 54 ?49 30.8  R Median - APB     Wrist APB 3.3 ?4.4 6.3 ?4.0 100 Wrist - APB 7   26.7     Upper arm APB 7.6  5.9  93.8 Upper arm - Wrist 22 52 ?49 26.4  L Ulnar - ADM     Wrist ADM 2.6 ?3.3 8.5 ?6.0 100 Wrist - ADM 7   31.0     B.Elbow ADM 6.1  6.5  76.4 B.Elbow - Wrist 20 56 ?49 24.3     A.Elbow ADM 7.4  7.4  114 A.Elbow - B.Elbow 8 61 ?49 28.7         A.Elbow - Wrist      R Ulnar -  ADM     Wrist ADM 2.3 ?3.3 6.4 ?6.0 100 Wrist - ADM 7   28.0     B.Elbow ADM 5.9  5.6  88 B.Elbow - Wrist 20 56 ?49 24.1     A.Elbow ADM 7.6  5.7  101 A.Elbow - B.Elbow 10 60 ?49 23.9         A.Elbow - Wrist      R Peroneal - EDB     Ankle EDB 5.3 ?6.5 7.2 ?2.0 100 Ankle - EDB 9   33.4     Fib head EDB 11.9  7.3  100 Fib head - Ankle 31 47 ?44 36.5     Pop fossa EDB 13.9  7.1  98.3 Pop fossa - Fib head 10 51 ?44 35.2         Pop fossa - Ankle      R Tibial - AH     Ankle AH 5.7 ?5.8 14.9 ?4.0 100 Ankle - AH 9   53.5     Pop fossa AH 14.0  13.2  88.4 Pop fossa - Ankle 36 43 ?41 47.2  SNC    Nerve / Sites Rec. Site Peak Lat Ref.  Amp Ref. Segments Distance Peak Diff Ref.    ms ms V V  cm ms ms  L Radial - Anatomical snuff box (Forearm)     Forearm Wrist 2.7 ?2.9 24 ?15 Forearm - Wrist 10    R Radial - Anatomical snuff box (Forearm)     Forearm Wrist 2.6 ?2.9 19 ?15 Forearm - Wrist 10    R Sural - Ankle (Calf)     Calf Ankle 3.8 ?4.4 10 ?6 Calf - Ankle 14    R Superficial peroneal - Ankle     Lat leg Ankle 3.9 ?4.4 21 ?6 Lat leg - Ankle 14    L Median, Ulnar - Transcarpal comparison     Median Palm Wrist 2.0 ?2.2 65 ?35 Median Palm - Wrist 8       Ulnar Palm Wrist 1.9 ?2.2 25 ?12 Ulnar Palm - Wrist 8          Median Palm - Ulnar Palm  0.1 ?0.4  R Median, Ulnar - Transcarpal comparison     Median Palm Wrist 2.7 ?2.2 33 ?35 Median Palm - Wrist 8       Ulnar Palm Wrist 1.8 ?2.2 14 ?12 Ulnar Palm - Wrist 8          Median Palm - Ulnar Palm  0.9 ?0.4  L Median - Orthodromic (Dig II, Mid palm)     Dig II Wrist 3.0 ?3.4 16 ?10 Dig II - Wrist 13    R Median - Orthodromic (Dig II, Mid palm)     Dig II Wrist 3.3 ?3.4 26 ?10 Dig II - Wrist 13    L Ulnar - Orthodromic, (Dig V, Mid palm)     Dig V Wrist 2.8 ?3.1 13 ?5 Dig V - Wrist 11    R Ulnar - Orthodromic, (Dig V, Mid palm)     Dig V Wrist 2.4 ?3.1 15 ?5 Dig V - Wrist 11                             F  Wave    Nerve  F Lat Ref.   ms ms  L Ulnar - ADM 26.9 ?32.0  R Ulnar - ADM 26.0 ?32.0  R Tibial - AH 50.5 ?56.0           EMG full       EMG Summary Table    Spontaneous MUAP Recruitment  Muscle IA Fib PSW Fasc Other Amp Dur. Poly Pattern  R. Deltoid Normal None None None _______ Normal Normal Normal Normal  R. Triceps brachii Normal None None None _______ Normal Normal Normal Normal  R. Pronator teres Normal None None None _______ Normal Normal Normal Normal  R. First dorsal interosseous Normal None None None _______ Normal Normal Normal Normal  R. Opponens pollicis Normal None None None _______ Normal Normal Normal Normal  R. Cervical paraspinals (low) Normal None None None _______ Normal Normal Normal Normal  R. Lumbar paraspinals (low) Normal None None None _______ Normal Normal Normal Normal  R. Tibialis anterior Normal None None None _______ Normal Normal Normal Normal  R. Gastrocnemius (Medial head) Normal None None None _______ Normal Normal Normal Normal  R. Vastus medialis Normal None None None _______ Normal Normal Normal Normal  R. Iliopsoas Normal None None None _______ Normal Normal Normal Normal  R. Biceps femoris (long head)   Normal None None None _______ Normal Normal Normal Normal

## 2017-05-14 ENCOUNTER — Ambulatory Visit: Payer: BLUE CROSS/BLUE SHIELD | Admitting: Neurology

## 2017-06-13 ENCOUNTER — Encounter: Payer: Self-pay | Admitting: Neurology

## 2017-06-13 ENCOUNTER — Ambulatory Visit (INDEPENDENT_AMBULATORY_CARE_PROVIDER_SITE_OTHER): Payer: BLUE CROSS/BLUE SHIELD | Admitting: Neurology

## 2017-06-13 VITALS — BP 114/67 | HR 77 | Ht 67.5 in | Wt 181.0 lb

## 2017-06-13 DIAGNOSIS — R209 Unspecified disturbances of skin sensation: Secondary | ICD-10-CM | POA: Diagnosis not present

## 2017-06-13 DIAGNOSIS — IMO0001 Reserved for inherently not codable concepts without codable children: Secondary | ICD-10-CM

## 2017-06-13 NOTE — Progress Notes (Signed)
fSubjective:    Barrera ID: Charlene Barrera is a 47 y.o. female.  HPI     Interim history:   Charlene Barrera is a 47 year old right-handed woman with an underlying medical history of Raynaud's syndrome, sicca syndrome, arthralgia, vitamin D deficiency, IBS, and overweight state, who presents for follow-up consultation of Charlene Barrera paresthesias. Charlene Barrera is unaccompanied today. Charlene Barrera on 02/12/2017 at Charlene request of Charlene Barrera rheumatologist, at which time Charlene Barrera reported intermittent tingling and numbness around Charlene Barrera upper lip, right thumb and forefinger and left thumb, occasional headaches starting in Charlene posterior aspect of Charlene Barrera head. Charlene Barrera exam is nonfocal. Charlene suggested workup in Charlene form of blood work, EMG and nerve conduction testing of brain MRI.  Charlene Barrera blood work showed benign findings and 02/12/2017. We checked vitamin B1, multiple myeloma panel, HTLV antibodies, ferritin, iron and TIBC, TSH, B12 and folate, vitamin D, A1c. We called Charlene Barrera with Charlene Barrera test results.  Charlene Barrera had a EMG and nerve conduction test on 03/01/2017 and Charlene reviewed Charlene results: Conclusion: This is an essentially normal EMG nerve conduction study of Charlene bilateral uppers and right lower extremity with Charlene exception of possibly mild/early right carpal tunnel syndrome.  Otherwise no suggestion of polyneuropathy, myopathy or myositis, neuromuscular junction disorder, cervical or lumbar radiculopathy. Brain MRI with and without contrast on 02/28/2017 and Charlene reviewed Charlene results: IMPRESSION: Abnormal MRI scan of Charlene brain showing tiny punctate nonspecific periventricular and subcortical white matter hyperintensities with a differential discussed above.  No enhancing lesions are noted.  We called Charlene Barrera with Charlene Barrera test results.  Today, 06/13/2017: Charlene Barrera reports feeling better. Charlene Barrera changed Charlene Barrera pillow and started doing Yoga and these changes have helped. Charlene Barrera was going regularly. Very occasionally does Charlene Barrera still have some perioral tingling. When Charlene Barrera is on Charlene Barrera  feet a lot Charlene Barrera has some tingling down Charlene Barrera thighs and lower legs but this goes away. Charlene Barrera has not had any sustained numbness or new symptoms. Charlene Barrera headaches are better. Charlene Barrera hydrates well with water, avoid soda, drinks 1 cup of coffee in Charlene morning typically. Charlene Barrera twin boys are graduating from college this year and are going into Charlene [employer]. Charlene Barrera has a daughter in high school.  Charlene Barrera's allergies, current medications, family history, past medical history, past social history, past surgical history and problem list were reviewed and updated as appropriate.   Previously:  02/12/2017: (Charlene Barrera) reports intermittent numbness and tingling in Charlene Barrera upper lip, also right-sided thumb and forefinger and left thumb. Charlene Barrera has had some recurrent headaches, mostly starting in Charlene posterior area, feels like tension and Charlene Barrera neck muscles and base of Charlene skull. Light massaging helps but deep massaging causes Charlene Barrera to feel dizzy. Charlene Barrera has no history of diabetes or gestational diabetes. Charlene Barrera has 3 grown children, 66 year old daughter and twin boys age 1. 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 Charlene Barrera does not endorse any migrainous headaches. Charlene Barrera is a nonsmoker and drinks alcohol infrequently, maybe twice a month or so and reports multiple food intolerances and allergies. Charlene Barrera has been on vitamin D supplementation per GI recommendation. Charlene Barrera drinks caffeine in Charlene form of coffee, usually 2 cups per day, hydrates well with water. 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 Charlene Barrera has never been on methotrexate and has been on Platinol for about a year, in Charlene process  of reducing Charlene dose. Charlene reviewed your office note from 11/21/2016. Charlene Barrera had blood work through your office including complement C3, normal, CRP normal, complement C4 normal, ESR 0, ANA negative. Charlene Barrera does not report any actual  weakness, no pain. Charlene Barrera had some tingling in both thighs Charlene other day that felt like pins and needles, nothing sustained, Charlene Barrera symptoms come and go and there is no telltale trigger.  Charlene Barrera Past Medical History Is Significant For: Past Medical History:  Diagnosis Date  . Anemia   . Diarrhea   . Duodenitis   . Sjogren's disease ([place])     Charlene Barrera Past Surgical History Is Significant For: Past Surgical History:  Procedure Laterality Date  . APPENDECTOMY    . CESAREAN SECTION    . FACIAL COSMETIC SURGERY    . HERNIA REPAIR    . SHOULDER ARTHROSCOPY      Charlene Barrera Family History Is Significant For: Family History  Problem Relation Age of Onset  . Breast cancer Mother   . Breast cancer Paternal Aunt     Charlene Barrera Social History Is Significant For: Social History   Socioeconomic History  . Marital status: Married    Spouse name: Not on file  . Number of children: Not on file  . Years of education: Not on file  . Highest education level: Not on file  Occupational History  . Not on file  Social Needs  . Financial resource strain: Not on file  . Food insecurity:    Worry: Not on file    Inability: Not on file  . Transportation needs:    Medical: Not on file    Non-medical: Not on file  Tobacco Use  . Smoking status: Never Smoker  . Smokeless tobacco: Never Used  Substance and Sexual Activity  . Alcohol use: Yes    Comment: occasional  . Drug use: No  . Sexual activity: Not on file  Lifestyle  . Physical activity:    Days per week: Not on file    Minutes per session: Not on file  . Stress: Not on file  Relationships  . Social connections:    Talks on phone: Not on file    Gets together: Not on file    Attends religious service: Not on file    Active member of club or organization: Not on file    Attends meetings of clubs or organizations: Not on file    Relationship status: Not on file  Other Topics Concern  . Not on file  Social History Narrative  . Not on file    Charlene Barrera  Allergies Are:  Allergies  Allergen Reactions  . Pineapple   . Wheat Bran   . Yeast-Related Products   :   Charlene Barrera Current Medications Are:  Outpatient Encounter Medications as of 06/13/2017  Medication Sig  . Cholecalciferol (VITAMIN D) 2000 units tablet Take by mouth.  . hydroxychloroquine (PLAQUENIL) 200 MG tablet Take 200 mg by mouth daily.   Marland Kitchen Lifitegrast (XIIDRA) 5 % SOLN Apply 1 drop to eye daily.  . Multiple Vitamin (MULTI-VITAMINS) TABS Take by mouth.  . Probiotic Product (PROBIOTIC ADVANCED PO) Take by mouth.   Facility-Administered Encounter Medications as of 06/13/2017  Medication  . 0.9 %  sodium chloride infusion  :  Review of Systems:  Out of a complete 14 point review of systems, all are reviewed and negative with Charlene exception of these symptoms as listed below: Review of Systems  Neurological:  Pt presents today for follow up on Charlene Barrera numbness and tingling. Pt has started doing yoga which has helped.    Objective:  Neurological Exam  Physical Exam Physical Examination:   Vitals:   06/13/17 1304  BP: 114/67  Pulse: 77    General Examination: Charlene Barrera is a very pleasant 47 y.o. female in no acute distress. Charlene Barrera appears well-developed and well-nourished and very well groomed. Good spirits.   HEENT: Normocephalic, atraumatic, pupils are reactive to light. Extraocular tracking is good without limitation to gaze excursion or nystagmus noted. Normal smooth pursuit is noted. Hearing is grossly intact. Face is slightly asymmetric with right more than left facial weakness noted, stable. Speech is clear with no dysarthria noted. There is no hypophonia. There is no lip, neck/head, jaw or voice tremor. Neck with FROM.   Chest: Clear to auscultation without wheezing, rhonchi or crackles noted.  Heart: S1+S2+0, regular and normal without murmurs, rubs or gallops noted.   Abdomen: Soft, non-tender and non-distended with normal bowel sounds appreciated on  auscultation.  Extremities: There is no pitting edema in Charlene distal lower extremities bilaterally. Pedal pulses are intact.  Skin: Warm and dry without trophic changes noted.  Musculoskeletal: exam reveals no obvious joint deformities, tenderness or joint swelling or erythema.   Neurologically:  Mental status: Charlene Barrera is awake, alert and oriented in all 4 spheres. Charlene Barrera immediate and remote memory, attention, language skills and fund of knowledge are appropriate. There is no evidence of aphasia, agnosia, apraxia or anomia. Speech is clear with normal prosody and enunciation. Thought process is linear. Mood is normal and affect is normal.  Cranial nerves II - XII are as described above under HEENT exam. Motor exam: Normal bulk, strength and tone is noted. Reflexes are 2+ throughout.  Cerebellar testing: No dysmetria or intention tremor. There is no truncal or gait ataxia.  Sensory exam: intact to light touch.  Gait, station and balance: Charlene Barrera stands up without difficulty and posture is normal. Gait is normal, no problems turning.  Assessment and Plan:   In summary, Tyonna Talerico is a very pleasant 47 y.o.-year old female with an underlying medical history of Raynaud's syndrome, sicca syndrome, arthralgia, vitamin D deficiency, IBS, and overweight state, who presents for follow-up consultation of Charlene Barrera intermittent paresthesias. Workup in Charlene form of blood work, EMG and nerve conduction testing and brain MRI was benign. Charlene Barrera has evidence of mild right-sided carpal tunnel syndrome. Charlene Barrera does have occasional symptoms in that regard and uses a splint sometimes at night. Charlene Barrera feels improved in that Charlene Barrera neck pain and posterior headaches are better after Charlene Barrera changed Charlene Barrera pillow. Charlene Barrera has also started doing yoga which has helped overall. Charlene Barrera feels that Charlene Barrera has benefited from yoga in terms of stress reduction as well. Charlene Barrera is encouraged to continue to exercise on a regular basis and stay well-hydrated with  water. Charlene Barrera exam looks stable and nonfocal. Charlene Barrera is advised to follow-up on an as-needed basis.  Charlene answered all Charlene Barrera questions today and Charlene Barrera was in agreement. Charlene spent 20 minutes in total face-to-face time with Charlene Barrera, more than 50% of which was spent in counseling and coordination of care, reviewing test results, reviewing medication and discussing or reviewing Charlene diagnosis of Paresthesias, Charlene prognosis and treatment options. Pertinent laboratory and imaging test results that were available during this visit with Charlene Barrera were reviewed by me and considered in my medical decision making (see chart for details).

## 2017-06-13 NOTE — Patient Instructions (Signed)
You look well, I am glad you are doing better. Work up with blood work, EMG and brain scan was benign too!

## 2018-12-15 ENCOUNTER — Observation Stay (HOSPITAL_COMMUNITY)
Admission: EM | Admit: 2018-12-15 | Discharge: 2018-12-16 | Disposition: A | Discharge status: Home / Self Care | Payer: BC Managed Care – PPO | Attending: Internal Medicine | Admitting: Internal Medicine

## 2018-12-15 ENCOUNTER — Other Ambulatory Visit: Payer: Self-pay

## 2018-12-15 ENCOUNTER — Observation Stay (HOSPITAL_COMMUNITY): Payer: BC Managed Care – PPO

## 2018-12-15 ENCOUNTER — Encounter (HOSPITAL_COMMUNITY): Payer: Self-pay | Admitting: Emergency Medicine

## 2018-12-15 ENCOUNTER — Emergency Department (HOSPITAL_COMMUNITY): Payer: BC Managed Care – PPO

## 2018-12-15 DIAGNOSIS — Z20828 Contact with and (suspected) exposure to other viral communicable diseases: Secondary | ICD-10-CM | POA: Insufficient documentation

## 2018-12-15 DIAGNOSIS — G43409 Hemiplegic migraine, not intractable, without status migrainosus: Secondary | ICD-10-CM

## 2018-12-15 DIAGNOSIS — G43109 Migraine with aura, not intractable, without status migrainosus: Principal | ICD-10-CM | POA: Insufficient documentation

## 2018-12-15 DIAGNOSIS — I517 Cardiomegaly: Secondary | ICD-10-CM | POA: Diagnosis not present

## 2018-12-15 DIAGNOSIS — M3500 Sicca syndrome, unspecified: Secondary | ICD-10-CM | POA: Diagnosis not present

## 2018-12-15 DIAGNOSIS — R6889 Other general symptoms and signs: Secondary | ICD-10-CM | POA: Insufficient documentation

## 2018-12-15 DIAGNOSIS — G459 Transient cerebral ischemic attack, unspecified: Secondary | ICD-10-CM | POA: Diagnosis present

## 2018-12-15 DIAGNOSIS — Z79899 Other long term (current) drug therapy: Secondary | ICD-10-CM | POA: Insufficient documentation

## 2018-12-15 LAB — CBC
HCT: 39.3 % (ref 36.0–46.0)
Hemoglobin: 12.8 g/dL (ref 12.0–15.0)
MCH: 33.2 pg (ref 26.0–34.0)
MCHC: 32.6 g/dL (ref 30.0–36.0)
MCV: 101.8 fL — ABNORMAL HIGH (ref 80.0–100.0)
Platelets: 349 10*3/uL (ref 150–400)
RBC: 3.86 MIL/uL — ABNORMAL LOW (ref 3.87–5.11)
RDW: 12.9 % (ref 11.5–15.5)
WBC: 7.5 10*3/uL (ref 4.0–10.5)
nRBC: 0 % (ref 0.0–0.2)

## 2018-12-15 LAB — COMPREHENSIVE METABOLIC PANEL
ALT: 23 U/L (ref 0–44)
AST: 28 U/L (ref 15–41)
Albumin: 3.7 g/dL (ref 3.5–5.0)
Alkaline Phosphatase: 50 U/L (ref 38–126)
Anion gap: 9 (ref 5–15)
BUN: 6 mg/dL (ref 6–20)
CO2: 25 mmol/L (ref 22–32)
Calcium: 9.4 mg/dL (ref 8.9–10.3)
Chloride: 105 mmol/L (ref 98–111)
Creatinine, Ser: 0.82 mg/dL (ref 0.44–1.00)
GFR calc Af Amer: >60 mL/min (ref >60)
GFR calc non Af Amer: >60 mL/min (ref >60)
Glucose, Bld: 103 mg/dL — ABNORMAL HIGH (ref 70–99)
Potassium: 4.1 mmol/L (ref 3.5–5.1)
Sodium: 139 mmol/L (ref 135–145)
Total Bilirubin: 0.5 mg/dL (ref 0.3–1.2)
Total Protein: 7.4 g/dL (ref 6.5–8.1)

## 2018-12-15 LAB — HEMOGLOBIN A1C
Hgb A1c MFr Bld: 4.8 % (ref 4.8–5.6)
Mean Plasma Glucose: 91.06 mg/dL

## 2018-12-15 LAB — DIFFERENTIAL
Abs Immature Granulocytes: 0.02 10*3/uL (ref 0.00–0.07)
Basophils Absolute: 0 10*3/uL (ref 0.0–0.1)
Basophils Relative: 0 %
Eosinophils Absolute: 0.2 10*3/uL (ref 0.0–0.5)
Eosinophils Relative: 2 %
Immature Granulocytes: 0 %
Lymphocytes Relative: 25 %
Lymphs Abs: 1.9 10*3/uL (ref 0.7–4.0)
Monocytes Absolute: 0.5 10*3/uL (ref 0.1–1.0)
Monocytes Relative: 7 %
Neutro Abs: 4.9 10*3/uL (ref 1.7–7.7)
Neutrophils Relative %: 66 %

## 2018-12-15 LAB — LIPID PANEL
Cholesterol: 131 mg/dL (ref 0–200)
HDL: 41 mg/dL (ref >40)
LDL Cholesterol: 82 mg/dL (ref 0–99)
Total CHOL/HDL Ratio: 3.2 RATIO
Triglycerides: 41 mg/dL (ref <150)
VLDL: 8 mg/dL (ref 0–40)

## 2018-12-15 LAB — CBG MONITORING, ED: Glucose-Capillary: 77 mg/dL (ref 70–99)

## 2018-12-15 LAB — I-STAT BETA HCG BLOOD, ED (MC, WL, AP ONLY): I-stat hCG, quantitative: <5 m[IU]/mL (ref <5)

## 2018-12-15 MED ORDER — IOHEXOL 350 MG/ML SOLN
80.0000 mL | Freq: Once | INTRAVENOUS | Status: AC | PRN
  Administered 2018-12-15: 80 mL via INTRAVENOUS

## 2018-12-15 MED ORDER — SODIUM CHLORIDE 0.9% FLUSH
3.0000 mL | Freq: Once | INTRAVENOUS | Status: DC
Start: 2018-12-15 — End: 2018-12-16

## 2018-12-15 MED ORDER — HYDROXYCHLOROQUINE SULFATE 200 MG PO TABS
200.0000 mg | ORAL_TABLET | Freq: Every day | ORAL | Status: DC
  Administered 2018-12-15: 200 mg via ORAL
  Filled 2018-12-15 (×2): qty 1

## 2018-12-15 MED ORDER — POLYVINYL ALCOHOL 1.4 % OP SOLN
1.0000 [drp] | Freq: Two times a day (BID) | OPHTHALMIC | Status: DC
  Administered 2018-12-15 – 2018-12-16 (×2): 1 [drp] via OPHTHALMIC
  Filled 2018-12-15: qty 15

## 2018-12-15 MED ORDER — ASPIRIN 325 MG PO TABS
325.0000 mg | ORAL_TABLET | Freq: Every day | ORAL | Status: DC
  Administered 2018-12-15: 21:00:00 325 mg via ORAL
  Filled 2018-12-15: qty 1

## 2018-12-15 MED ORDER — POLYETHYL GLYCOL-PROPYL GLYCOL 0.4-0.3 % OP SOLN: 1.0000 [drp] | Freq: Two times a day (BID) | OPHTHALMIC | Status: DC

## 2018-12-15 MED ORDER — HYDROCODONE-ACETAMINOPHEN 5-325 MG PO TABS
1.0000 | ORAL_TABLET | ORAL | Status: DC | PRN
  Administered 2018-12-15 – 2018-12-16 (×2): 1 via ORAL
  Filled 2018-12-15: qty 1
  Filled 2018-12-15: qty 2

## 2018-12-15 MED ORDER — ENOXAPARIN SODIUM 40 MG/0.4ML ~~LOC~~ SOLN
40.0000 mg | SUBCUTANEOUS | Status: DC
  Administered 2018-12-16: 40 mg via SUBCUTANEOUS
  Filled 2018-12-15: qty 0.4

## 2018-12-15 NOTE — H&P (Addendum)
Date: 12/15/2018               Patient Name:  Charlene Barrera MRN: [id number]  DOB: Mar 24, 1970 Age / Sex: 48 y.o., female   PCP: Laney Pastor Jenetta Downer, MD              Medical Service: Internal Medicine Teaching Service              Attending Physician: Dr. Lynnae January, Real Cons, MD    First Contact: Ms. Heriberto Antigua, [place] Pager: [phone]  Second Contact: Dr. Sharon Seller Pager: [phone]  Third Contact Dr. Marianna Payment Pager: [phone]       After Hours (After 5p/  First Contact Pager: [phone]  weekends / holidays): Second Contact Pager: [phone]   Chief Complaint: TIA  History of Present Illness:  Charlene Barrera is a 48 year old female with PMHX of anemia, Sjogren's, duodenitis, and cervical stenosis s/p surgery 9/24 presenting s/p transient right sided weakness leg and arm weakness and acute on chronic right facial droop. She reports waking up at 4 am and her husband noticed her face was significantly asymmetric. 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 She had no changes in speech or mentation. No changes in vision. Some residual headache that was still there in the ED, which was relieved with ibuprofen. Symptoms of weakness resolved after 30 minutes prior to EMS arrival.   Denies chest pain, dizziness, SOB, palpitations, abdominal pain, leg swelling and hx of VTEs. No past hx of strokes. She has chronic dry mouth and dry eyes from her Sjogren's. She takes plaquenil for this and follows up with her doctor in October. She denies recent changes in medications except for hydrocodone for pain since surgery. She has been recovering well since her surgery.   Reports recent cervical stenosis surgery on 9/24.    Meds: Current Facility-Administered Medications  Medication Dose Route Frequency Provider Last Rate Last Dose  . 0.9 %  sodium chloride infusion  500 mL Intravenous Continuous Danis, Estill Cotta III, MD      . aspirin tablet 325 mg  325 mg Oral Daily Keahi Mccarney A, DO       . [START ON 12/16/2018] enoxaparin (LOVENOX) injection 40 mg  40 mg Subcutaneous Q24H Elfreda Blanchet A, DO      . sodium chloride flush (NS) 0.9 % injection 3 mL  3 mL Intravenous Once Valarie Merino, MD       Current Outpatient Medications  Medication Sig Dispense Refill  . acetaminophen (TYLENOL) 500 MG tablet Take 250 mg by mouth every 6 (six) hours as needed (pain).    . Calcium Carbonate-Vitamin D (CALCIUM-D PO) Take 1 tablet by mouth at bedtime.    . cholecalciferol (VITAMIN D3) 25 MCG (1000 UT) tablet Take 4,000 Units by mouth at bedtime.    . Cyanocobalamin (VITAMIN B-12 PO) Take 1 tablet by mouth at bedtime.    . cyclobenzaprine (FLEXERIL) 10 MG tablet Take 10 mg by mouth 3 (three) times daily.    . ferrous sulfate 325 (65 FE) MG tablet Take 325 mg by mouth at bedtime.    Marland Kitchen HYDROcodone-acetaminophen (NORCO/VICODIN) 5-325 MG tablet Take 1-2 tablets by mouth every 4 (four) hours as needed (pain).     . hydroxychloroquine (PLAQUENIL) 200 MG tablet Take 200 mg by mouth at bedtime.     Marland Kitchen ibuprofen (ADVIL) 200 MG tablet Take 200-400 mg by mouth every 4 (four) hours as needed (pain).    . Multiple  Vitamin (MULTIVITAMIN WITH MINERALS) TABS tablet Take 1 tablet by mouth at bedtime.    Bertram Gala. Polyethyl Glycol-Propyl Glycol (SYSTANE ULTRA) 0.4-0.3 % SOLN Place 1 drop into both eyes 2 (two) times daily.    . Probiotic Product (PROBIOTIC ADVANCED PO) Take 2 tablets by mouth at bedtime.       Allergies: Allergies as of 12/15/2018 - Review Complete 12/15/2018  Allergen Reaction Noted  . Other Diarrhea 12/15/2018  . Pineapple Swelling and Other (See Comments) 06/13/2017  . Wheat bran Diarrhea and Nausea And Vomiting 05/26/2016  . Yeast-related products Nausea And Vomiting and Other (See Comments) 05/30/2016   Past Medical History:  Diagnosis Date  . Anemia   . Diarrhea   . Duodenitis   . Sjogren's disease [facility](HCC)    Past Surgical History:  Procedure Laterality Date  . APPENDECTOMY    .  CESAREAN SECTION    . FACIAL COSMETIC SURGERY    . HERNIA REPAIR    . NECK SURGERY    . SHOULDER ARTHROSCOPY     Family Hx: Heart disease and stroke.   Social Hx: No smoking hx or drug use. Drinks socially.    Review of Systems: Review of Systems  Constitutional: Negative for fever.  Eyes: Negative for blurred vision and double vision.  Respiratory: Negative for shortness of breath.   Cardiovascular: Negative for chest pain, palpitations and leg swelling.  Gastrointestinal: Negative for abdominal pain, constipation and diarrhea.  Musculoskeletal: Positive for neck pain.  Neurological: Positive for focal weakness, weakness and headaches. Negative for sensory change and speech change.   Physical Exam: Blood pressure 102/69, pulse 82, temperature 98.6 F (37 C), temperature source Oral, resp. rate 12, SpO2 96 %. Physical Exam  Constitutional: She is oriented to person, place, and time. She appears well-developed and well-nourished.  HENT:  Head: Normocephalic and atraumatic.  Eyes: Pupils are equal, round, and reactive to light. Conjunctivae and EOM are normal.  Cardiovascular: Normal rate and regular rhythm.  No murmur heard. Pulmonary/Chest: Effort normal and breath sounds normal. No respiratory distress.  Abdominal: Soft. Bowel sounds are normal. She exhibits no distension. There is no abdominal tenderness.  Musculoskeletal: Normal range of motion.  Neurological: She is alert and oriented to person, place, and time. A cranial nerve deficit (mild right facial droop, otherwise CN II-XII intact) is present. She displays a negative Romberg sign.  Nose-to-finger normal.  Skin: Skin is warm.  Psychiatric: She has a normal mood and affect.    Lab results: CBC Latest Ref Rng & Units 12/15/2018 05/30/2016  WBC 4.0 - 10.5 K/uL 7.5 8.9  Hemoglobin 12.0 - 15.0 g/dL 30.812.8 65.713.6  Hematocrit 84.636.0 - 46.0 % 39.3 41.1  Platelets 150 - 400 K/uL 349 253.0   CMP Latest Ref Rng & Units 12/15/2018  02/12/2017 05/30/2016  Glucose 70 - 99 mg/dL 962(X103(H) - 91  BUN 6 - 20 mg/dL 6 - 10  Creatinine 5.280.44 - 1.00 mg/dL 4.130.82 - 2.440.83  Sodium 010135 - 145 mmol/L 139 - 137  Potassium 3.5 - 5.1 mmol/L 4.1 - 3.9  Chloride 98 - 111 mmol/L 105 - 103  CO2 22 - 32 mmol/L 25 - 27  Calcium 8.9 - 10.3 mg/dL 9.4 - 9.6  Total Protein 6.5 - 8.1 g/dL 7.4 6.8 7.4  Total Bilirubin 0.3 - 1.2 mg/dL 0.5 - 0.6  Alkaline Phos 38 - 126 U/L 50 - 35(L)  AST 15 - 41 U/L 28 - 18  ALT 0 - 44 U/L 23 -  12   Imaging results:  CT Head: No acute intracranial abnormalities with normal appearance of brain.   Other results: EKG: nonspecific ST and T waves changes  Assessment & Plan by Problem: Active Problems:   TIA (transient ischemic attack)  TIA: Presented with transient right sided weakness, right sided facial droop and right leg weakness resolving after 30 minutes. No symptoms in ED outside of headache. CT head in ED without evidence of acute infarction or intracranial abnormalities. Neuro consulted and recommended full stroke work-up. -- MRI Brain -- CTA head and neck -- Echocardiogram to assess for thrombus  -- ASA 325 mg -- start statin pending lipid panel  -- A1c and Lipid panel -- PT consult, OT consult, Speech consult --Telemetry monitoring to assess for atrial fibrillation  -- Frequent neuro checks  -- BP goal <220/<110 mmHg - permissive hypertension  Sjogren's Disorder - cont. Plaquenil  - cont. systane eye drops for dry eyes   Diet: Soft diet DVT: Enoxaparin 40 mg Dispo: Admit for stroke workup Code: Full Code   This is a Psychologist, occupational Note.  The care of the patient was discussed with Dr. Cleaster Corin and the assessment and plan was formulated with their assistance.  Please see their note for official documentation of the patient encounter.   Signed: Chauncey Fischer, Medical Student 12/15/2018, 7:16 PM   Attestation for Student Documentation:  I personally was present and performed or re-performed  the history, physical exam and medical decision-making activities of this service and have verified that the service and findings are accurately documented in the student's note.  Ayesha Markwell A, DO 12/15/2018, 9:29 PM

## 2018-12-15 NOTE — ED Provider Notes (Signed)
[facility] EMERGENCY DEPARTMENT Provider Note   CSN: [id number] Arrival date & time: 12/15/18  1359     History   Chief Complaint Chief Complaint  Patient presents with   Transient Ischemic Attack    HPI Charlene Barrera is a 48 y.o. female.     48 year old female with prior medical history as detailed below presents for evaluation of resolved right-sided weakness.  Patient reports that she awoke this morning and had right facial droop, right arm weakness, and right leg weakness.  Her symptoms lasted approximately 30 minutes.  She has complete resolution of her symptoms now.  She denies prior history of CVA or TIA.  She reports recent cervical spine surgery.  She was unsure as to whether her symptoms might be related to the recent surgery.  She denies fever.  She denies current headache, visual change, speech change, weakness, chest pain, shortness of breath, or other acute complaint.  The history is provided by the patient and medical records.  Illness Location:  Weakness of right arm and leg, right facial droop, symptoms resolved  Severity:  Moderate Onset quality:  Sudden Duration:  30 minutes Timing:  Rare Progression:  Resolved Chronicity:  New Associated symptoms: headaches   Associated symptoms: no chest pain and no shortness of breath     Past Medical History:  Diagnosis Date   Anemia    Diarrhea    Duodenitis    Sjogren's disease (HCC)     There are no active problems to display for this patient.   Past Surgical History:  Procedure Laterality Date   APPENDECTOMY     CESAREAN SECTION     FACIAL COSMETIC SURGERY     HERNIA REPAIR     NECK SURGERY     SHOULDER ARTHROSCOPY       OB History   No obstetric history on file.      Home Medications    Prior to Admission medications   Medication Sig Start Date End Date Taking? Authorizing Provider  Cholecalciferol (VITAMIN D) 2000 units tablet Take by mouth.    [provider]  hydroxychloroquine (PLAQUENIL) 200 MG tablet Take 200 mg by mouth daily.     [provider]  Lifitegrast Benay Spice) 5 % SOLN Apply 1 drop to eye daily. 03/05/15   [provider]  Multiple Vitamin (MULTI-VITAMINS) TABS Take by mouth.    [provider]  Probiotic Product (PROBIOTIC ADVANCED PO) Take by mouth.    [provider]    Family History Family History  Problem Relation Age of Onset   Breast cancer Mother    Breast cancer Paternal Aunt     Social History Social History   Tobacco Use   Smoking status: Never Smoker   Smokeless tobacco: Never Used  Substance Use Topics   Alcohol use: Yes    Comment: occasional   Drug use: No     Allergies   Pineapple, Wheat bran, and Yeast-related products   Review of Systems Review of Systems  Respiratory: Negative for shortness of breath.   Cardiovascular: Negative for chest pain.  Neurological: Positive for headaches.  All other systems reviewed and are negative.    Physical Exam Updated Vital Signs BP 117/71    Pulse 88    Temp 98.6 F (37 C) (Oral)    Resp 13    SpO2 99%   Physical Exam Vitals signs and nursing note reviewed.  Constitutional:      General: She is  not in acute distress.    Appearance: She is well-developed.  HENT:     Head: Normocephalic and atraumatic.  Eyes:     Conjunctiva/sclera: Conjunctivae normal.     Pupils: Pupils are equal, round, and reactive to light.  Neck:     Musculoskeletal: Normal range of motion and neck supple.     Comments: Soft cervical collar in place. Wound to the anterior left neck is clean, dry, and intact.  Cardiovascular:     Rate and Rhythm: Normal rate and regular rhythm.     Heart sounds: Normal heart sounds.  Pulmonary:     Effort: Pulmonary effort is normal. No respiratory distress.     Breath sounds: Normal breath sounds.  Abdominal:     General: There is no distension.     Palpations: Abdomen is soft.      Tenderness: There is no abdominal tenderness.  Musculoskeletal: Normal range of motion.        General: No deformity.  Skin:    General: Skin is warm and dry.  Neurological:     Mental Status: She is alert and oriented to person, place, and time.      ED Treatments / Results  Labs (all labs ordered are listed, but only abnormal results are displayed) Labs Reviewed  CBC - Abnormal; Notable for the following components:      Result Value   RBC 3.86 (*)    MCV 101.8 (*)    All other components within normal limits  COMPREHENSIVE METABOLIC PANEL - Abnormal; Notable for the following components:   Glucose, Bld 103 (*)    All other components within normal limits  DIFFERENTIAL  CBG MONITORING, ED  I-STAT BETA HCG BLOOD, ED ([facility], [facility], [facility] ONLY)    EKG EKG Interpretation  Date/Time:  Sunday December 15 2018 14:14:52 EDT Ventricular Rate:  87 PR Interval:  124 QRS Duration: 86 QT Interval:  394 QTC Calculation: 474 R Axis:   57 Text Interpretation:  Normal sinus rhythm Nonspecific ST and T wave abnormality Abnormal ECG Confirmed by Dene Gentry [phone]) on 12/15/2018 3:43:46 PM   Radiology Ct Head Wo Contrast  Result Date: 12/15/2018 CLINICAL DATA:  48 year old female with history of right-sided facial numbness and difficulty moving her right arm early this morning. EXAM: CT HEAD WITHOUT CONTRAST TECHNIQUE: Contiguous axial images were obtained from the base of the skull through the vertex without intravenous contrast. COMPARISON:  No priors. FINDINGS: Brain: No evidence of acute infarction, hemorrhage, hydrocephalus, extra-axial collection or mass lesion/mass effect. Vascular: No hyperdense vessel or unexpected calcification. Skull: Normal. Negative for fracture or focal lesion. Sinuses/Orbits: No acute finding. Other: None. IMPRESSION: 1. No acute intracranial abnormalities. The appearance of the brain is normal. Electronically Signed   By: Vinnie Langton M.D.   On: 12/15/2018  15:16    Procedures Procedures (including critical care time)  Medications Ordered in ED Medications  sodium chloride flush (NS) 0.9 % injection 3 mL (has no administration in time range)     Initial Impression / Assessment and Plan / ED Course  I have reviewed the triage vital signs and the nursing notes.  Pertinent labs & imaging results that were available during my care of the patient were reviewed by me and considered in my medical decision making (see chart for details).        MDM  Screen complete  Jovonna Nickell was evaluated in Emergency Department on 12/15/2018 for the symptoms described in the history of present  illness. She was evaluated in the context of the global COVID-19 pandemic, which necessitated consideration that the patient might be at risk for infection with the SARS-CoV-2 virus that causes COVID-19. Institutional protocols and algorithms that pertain to the evaluation of patients at risk for COVID-19 are in a state of rapid change based on information released by regulatory bodies including the CDC and federal and state organizations. These policies and algorithms were followed during the patient's care in the ED.  Patient is presenting for evaluation of transient right-sided weakness of the right face, right arm, and right leg.  Symptoms have completely resolved at the time of her ED evaluation.  She is not a candidate for TPA given her complete resolution of symptoms.  Case discussed with the neuro team -Dr. Amada JupiterKirkpatrick -who feels that the patient likely will require admission for further work-up and treatment of possible TIA.  Hospitalist service is aware of case and will evaluate for admission.    Final Clinical Impressions(s) / ED Diagnoses   Final diagnoses:  TIA (transient ischemic attack)    ED Discharge Orders    None       Wynetta FinesMessick, Juelz Claar C, MD 12/15/18 1723

## 2018-12-15 NOTE — ED Triage Notes (Signed)
Pt reports she woke up at 4am with right sided facial numbness, right sided facial droop, decreased rom to right leg. Pt called 911 and withinn 15 minutes symptoms resolved. Pt has remaining headache that she describes as dull. Pt had recent neck surgery to repair C spine fx.

## 2018-12-15 NOTE — Consult Note (Addendum)
NEURO HOSPITALIST  CONSULT   Requesting Physician: Dr. Rodena Medin    Chief Complaint:   Right facial numbess and facial droop  History obtained from:  Patient     HPI:                                                                                                                                         Charlene Barrera is an 48 y.o. female PMH sjogrens disease, anemia who presented to the ED with c/o facial numbness, right facial droop.   Patient recently had surgery on c-spine and was unsure if these symptoms were related. But she has been sleeping in a recliner since surgery. She woke up at 4 am because her face felt "funny" then she realized it was numb. Her right arm was floppy/ weak and she wasn't able to control it. She called for her husband and tried to stand and realized that her right leg was weak as well. So they called EMS. Her husband states that she had facial droop > than her baseline. By the time EMS arrived her symptoms had resolved completely ( ~ 25 mins)  except for a HA that she stated felt like tension on the right side of her head. Rated 2/10  for pain. HA ( 2/10 pain)  still persist.  she decided not to come to hospital with EMS but called tele health and they recommended that she be seen in the ER.She did also state she was a little lightheaded at 4 am as well, but thinks it may have come from her taking hydrocodone for her neck pain. Denies any vision changes, N/V, SOB, anticoagulation, ASA, smoking or drug abuse. Endorses drinking wine socially. No prior stroke history noted. 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    ED course:  CTH: no hemorrhage BP: 117/71 BG: 103    Date last known well: 12/15/2018 Time last known well: 0400 tPA Given:no; outside of window Modified Rankin: Rankin Score=0 NIHSS:1 facial droop   Past Medical History:  Diagnosis Date  . Anemia   . Diarrhea   . Duodenitis   .  Sjogren's disease [facility])     Past Surgical History:  Procedure Laterality Date  . APPENDECTOMY    . CESAREAN SECTION    . FACIAL COSMETIC SURGERY    . HERNIA REPAIR    . NECK SURGERY    . SHOULDER ARTHROSCOPY      Family History  Problem Relation Age of Onset  . Breast cancer Mother   . Breast cancer Paternal Aunt  Social History:  reports that she has never smoked. She has never used smokeless tobacco. She reports current alcohol use. She reports that she does not use drugs.  Allergies:  Allergies  Allergen Reactions  . Pineapple   . Wheat Bran   . Yeast-Related Products     Medications:                                                                                                                           Current Facility-Administered Medications  Medication Dose Route Frequency Provider Last Rate Last Dose  . 0.9 %  sodium chloride infusion  500 mL Intravenous Continuous Danis, Henry L III, MD      . sodium chloride flush (NS) 0.9 % injection 3 mL  3 mL Intravenous Once Wynetta Fines, MD       Current Outpatient Medications  Medication Sig Dispense Refill  . Cholecalciferol (VITAMIN D) 2000 units tablet Take by mouth.    . hydroxychloroquine (PLAQUENIL) 200 MG tablet Take 200 mg by mouth daily.     Marland Kitchen Lifitegrast (XIIDRA) 5 % SOLN Apply 1 drop to eye daily.    . Multiple Vitamin (MULTI-VITAMINS) TABS Take by mouth.    . Probiotic Product (PROBIOTIC ADVANCED PO) Take by mouth.       ROS:                                                                                                                                       ROS was performed and is negative except as noted in HPI    General Examination:                                                                                                      Blood pressure 117/71, pulse 88, temperature 98.6 F (37 C), temperature source Oral, resp. rate 13, SpO2 99 %.  HEENT-  Normocephalic, no lesions, without  obvious abnormality.  Normal external eye  and conjunctiva. Cardiovascular-  pulses palpable throughout  Lungs- no excessive working breathing.  Saturations within normal limits on RA Extremities- Warm, dry and intact Musculoskeletal-no joint tenderness, deformity or swelling Skin-warm and dry, no hyperpigmentation, vitiligo, or suspicious lesions  Neurological Examination Mental Status: Alert, oriented, thought content appropriate. Naming and repetition intact, Speech fluent without evidence of aphasia.  Able to follow commands without difficulty. Cranial Nerves: II: Visual fields grossly normal,  III,IV, VI: ptosis not present, extra-ocular motions intact bilaterally, pupils equal, round, reactive to light and accommodation V,VII: smile asymmetric, slight right facial droop, cool temp and facial light touch sensation normal bilaterally VIII: hearing normal bilaterally IX,X: uvula rises midline XI: bilateral shoulder shrug XII: midline tongue extension Motor: Right : Upper extremity   5/5  Left:     Upper extremity   5/5  Lower extremity   5/5   Lower extremity   5/5 Tone and bulk:normal tone throughout; no atrophy noted Sensory: cool temp and light touch intact throughout, bilaterally Deep Tendon Reflexes: 2+ and symmetric biceps  Cerebellar: normal finger-to-nose, n and normal heel-to-shin test Gait: deferred   Lab Results: Basic Metabolic Panel: Recent Labs  Lab 12/15/18 1413  NA 139  K 4.1  CL 105  CO2 25  GLUCOSE 103*  BUN 6  CREATININE 0.82  CALCIUM 9.4    CBC: Recent Labs  Lab 12/15/18 1413  WBC 7.5  NEUTROABS 4.9  HGB 12.8  HCT 39.3  MCV 101.8*  PLT 349    CBG: Recent Labs  Lab 12/15/18 1551  GLUCAP 77    Imaging: Ct Head Wo Contrast  Result Date: 12/15/2018 CLINICAL DATA:  48 year old female with history of right-sided facial numbness and difficulty moving her right arm early this morning. EXAM: CT HEAD WITHOUT CONTRAST TECHNIQUE: Contiguous  axial images were obtained from the base of the skull through the vertex without intravenous contrast. COMPARISON:  No priors. FINDINGS: Brain: No evidence of acute infarction, hemorrhage, hydrocephalus, extra-axial collection or mass lesion/mass effect. Vascular: No hyperdense vessel or unexpected calcification. Skull: Normal. Negative for fracture or focal lesion. Sinuses/Orbits: No acute finding. Other: None. IMPRESSION: 1. No acute intracranial abnormalities. The appearance of the brain is normal. Electronically Signed   By: Vinnie Langton M.D.   On: 12/15/2018 15:16       Laurey Morale, MSN, NP-C [facility] [phone]  12/15/2018, 4:31 PM   Attending physician note to follow with Assessment and plan .  I have seen the patient reviewed the above note.  She describes numbness and weakness of her right side with out prominent paresthesia.  Though she does have a history of migraine with visual aura, she did not have any visual aura with this.  She did have some tingling of the fingertips on her right hand, but this was not a prominent component of the presentation.  Assessment: Charlene Barrera is an 48 y.o. female PMH sjogrens disease, anemia who presented to the ED with facial numbness, right facial droop most consistent with TIA.  One other possible differential would be complicated migraine, however there are no prominent features convincing for migraine versus TIA.  With her history of rheumatological disease, I do think she would be at a higher risk of hypercoagulable state, but would favor evaluation for other causes of TIA first.   Stroke Risk Factors - none  Recommendations: --MRI Brain  --CTA head and neck --Echocardiogram -- ASA -- High intensity Statin if LDL > 70 -- HgbA1c, fasting lipid panel --  PT consult, OT consult, Speech consult --Telemetry monitoring --Frequent neuro checks --Stroke swallow screen   --please page stroke NP  Or  PA  Or MD from 8am -4  pm  as this patient from this time will be  followed by the stroke.   You can look them up on [email or web address]  Password [email or web address]  Ritta SlotMcNeill Charlene Mirkin, MD [facility] [phone]  If 7pm- 7am, please page neurology on call as listed in AMION.'

## 2018-12-16 ENCOUNTER — Observation Stay (HOSPITAL_COMMUNITY): Payer: BC Managed Care – PPO

## 2018-12-16 ENCOUNTER — Observation Stay (HOSPITAL_BASED_OUTPATIENT_CLINIC_OR_DEPARTMENT_OTHER): Payer: BC Managed Care – PPO

## 2018-12-16 DIAGNOSIS — M3500 Sicca syndrome, unspecified: Secondary | ICD-10-CM | POA: Diagnosis not present

## 2018-12-16 DIAGNOSIS — D649 Anemia, unspecified: Secondary | ICD-10-CM

## 2018-12-16 DIAGNOSIS — K2980 Duodenitis without bleeding: Secondary | ICD-10-CM

## 2018-12-16 DIAGNOSIS — G43409 Hemiplegic migraine, not intractable, without status migrainosus: Secondary | ICD-10-CM

## 2018-12-16 DIAGNOSIS — Z981 Arthrodesis status: Secondary | ICD-10-CM

## 2018-12-16 DIAGNOSIS — R29810 Facial weakness: Secondary | ICD-10-CM

## 2018-12-16 DIAGNOSIS — G459 Transient cerebral ischemic attack, unspecified: Secondary | ICD-10-CM | POA: Diagnosis not present

## 2018-12-16 DIAGNOSIS — G43109 Migraine with aura, not intractable, without status migrainosus: Secondary | ICD-10-CM

## 2018-12-16 DIAGNOSIS — Z91018 Allergy to other foods: Secondary | ICD-10-CM

## 2018-12-16 DIAGNOSIS — G529 Cranial nerve disorder, unspecified: Secondary | ICD-10-CM | POA: Diagnosis not present

## 2018-12-16 DIAGNOSIS — G43909 Migraine, unspecified, not intractable, without status migrainosus: Secondary | ICD-10-CM

## 2018-12-16 DIAGNOSIS — Z79899 Other long term (current) drug therapy: Secondary | ICD-10-CM

## 2018-12-16 DIAGNOSIS — M4802 Spinal stenosis, cervical region: Secondary | ICD-10-CM

## 2018-12-16 DIAGNOSIS — R519 Headache, unspecified: Secondary | ICD-10-CM

## 2018-12-16 LAB — CBC
HCT: 37.5 % (ref 36.0–46.0)
Hemoglobin: 12.2 g/dL (ref 12.0–15.0)
MCH: 32.7 pg (ref 26.0–34.0)
MCHC: 32.5 g/dL (ref 30.0–36.0)
MCV: 100.5 fL — ABNORMAL HIGH (ref 80.0–100.0)
Platelets: 327 10*3/uL (ref 150–400)
RBC: 3.73 MIL/uL — ABNORMAL LOW (ref 3.87–5.11)
RDW: 13.1 % (ref 11.5–15.5)
WBC: 7.7 10*3/uL (ref 4.0–10.5)
nRBC: 0 % (ref 0.0–0.2)

## 2018-12-16 LAB — VITAMIN B12: Vitamin B-12: 1495 pg/mL — ABNORMAL HIGH (ref 180–914)

## 2018-12-16 LAB — COMPREHENSIVE METABOLIC PANEL
ALT: 24 U/L (ref 0–44)
AST: 24 U/L (ref 15–41)
Albumin: 3.3 g/dL — ABNORMAL LOW (ref 3.5–5.0)
Alkaline Phosphatase: 50 U/L (ref 38–126)
Anion gap: 10 (ref 5–15)
BUN: 9 mg/dL (ref 6–20)
CO2: 23 mmol/L (ref 22–32)
Calcium: 9.5 mg/dL (ref 8.9–10.3)
Chloride: 104 mmol/L (ref 98–111)
Creatinine, Ser: 0.78 mg/dL (ref 0.44–1.00)
GFR calc Af Amer: >60 mL/min (ref >60)
GFR calc non Af Amer: >60 mL/min (ref >60)
Glucose, Bld: 90 mg/dL (ref 70–99)
Potassium: 3.7 mmol/L (ref 3.5–5.1)
Sodium: 137 mmol/L (ref 135–145)
Total Bilirubin: 0.5 mg/dL (ref 0.3–1.2)
Total Protein: 6.7 g/dL (ref 6.5–8.1)

## 2018-12-16 LAB — SEDIMENTATION RATE: Sed Rate: 21 mm/hr (ref 0–22)

## 2018-12-16 LAB — ECHOCARDIOGRAM COMPLETE

## 2018-12-16 LAB — TSH: TSH: 1.135 u[IU]/mL (ref 0.350–4.500)

## 2018-12-16 LAB — HIV ANTIBODY (ROUTINE TESTING W REFLEX): HIV Screen 4th Generation wRfx: NONREACTIVE

## 2018-12-16 LAB — C-REACTIVE PROTEIN: CRP: <0.8 mg/dL (ref <1.0)

## 2018-12-16 LAB — SARS CORONAVIRUS 2 (TAT 6-24 HRS): SARS Coronavirus 2: NEGATIVE

## 2018-12-16 MED ORDER — CYCLOBENZAPRINE HCL 10 MG PO TABS
10.0000 mg | ORAL_TABLET | Freq: Three times a day (TID) | ORAL | Status: DC | PRN
  Filled 2018-12-16: qty 1

## 2018-12-16 MED ORDER — PROCHLORPERAZINE EDISYLATE 10 MG/2ML IJ SOLN
10.0000 mg | Freq: Once | INTRAMUSCULAR | Status: AC
  Administered 2018-12-16: 10 mg via INTRAVENOUS
  Filled 2018-12-16: qty 2

## 2018-12-16 MED ORDER — KETOROLAC TROMETHAMINE 15 MG/ML IJ SOLN
15.0000 mg | Freq: Once | INTRAMUSCULAR | Status: AC
  Administered 2018-12-16: 15 mg via INTRAVENOUS
  Filled 2018-12-16: qty 1

## 2018-12-16 MED ORDER — SODIUM CHLORIDE 0.9 % IV SOLN
25.0000 mg | Freq: Once | INTRAVENOUS | Status: DC
  Filled 2018-12-16: qty 1

## 2018-12-16 MED ORDER — CHLORPROMAZINE HCL 25 MG/ML IJ SOLN
25.0000 mg | Freq: Once | INTRAMUSCULAR | Status: DC
  Filled 2018-12-16: qty 1

## 2018-12-16 NOTE — ED Notes (Signed)
Breakfast at bedside.

## 2018-12-16 NOTE — Progress Notes (Signed)
STROKE TEAM PROGRESS NOTE   INTERVAL HISTORY Pt husband and TCD tech are at bedside. Pt recounted HPI with me. Pt had cervical disc herniation in 10/2018, status post cervical surgery on 9/29.  Since the injury, patient started to have neck pain triggered with her migraine headache, almost daily headache.  The night before, patient had some neck pain and tightness, she woke up 4 AM found to have funny feeling on the right face and then had right arm and leg numbness and weakness, not able to lift up right arm and right leg.  Symptom lasting 25 minutes resolved, followed by mild right-sided dull headache.  She stated that she had migraine when she was in high school and college but most prominent at her 100s.  However, she does not have headache for some time until recent neck injury.  When he had a migraine as younger age, she had visual aura with headache but no face numbness or arm or leg weakness.  Vitals:   12/16/18 0200 12/16/18 0300 12/16/18 0330 12/16/18 0400  BP: 97/61 (!) 98/51 (!) 98/54 96/60  Pulse: 88 83 85 83  Resp: 14 14 14 13   Temp:      TempSrc:      SpO2: 95% 95% 93% 95%    CBC:  Recent Labs  Lab 12/15/18 1413 12/16/18 0416  WBC 7.5 7.7  NEUTROABS 4.9  --   HGB 12.8 12.2  HCT 39.3 37.5  MCV 101.8* 100.5*  PLT 349 003    Basic Metabolic Panel:  Recent Labs  Lab 12/15/18 1413 12/16/18 0416  NA 139 137  K 4.1 3.7  CL 105 104  CO2 25 23  GLUCOSE 103* 90  BUN 6 9  CREATININE 0.82 0.78  CALCIUM 9.4 9.5   Lipid Panel:     Component Value Date/Time   CHOL 131 12/15/2018 2148   TRIG 41 12/15/2018 2148   HDL 41 12/15/2018 2148   CHOLHDL 3.2 12/15/2018 2148   VLDL 8 12/15/2018 2148   LDLCALC 82 12/15/2018 2148   HgbA1c:  Lab Results  Component Value Date   HGBA1C 4.8 12/15/2018   Urine Drug Screen: No results found for: LABOPIA, COCAINSCRNUR, LABBENZ, AMPHETMU, THCU, LABBARB  Alcohol Level No results found for: ETH  IMAGING Ct Angio Head W Or Wo  Contrast  Result Date: 12/15/2018 CLINICAL DATA:  Right-sided facial numbness and decreased range of motion to the right leg. EXAM: CT ANGIOGRAPHY HEAD AND NECK TECHNIQUE: Multidetector CT imaging of the head and neck was performed using the standard protocol during bolus administration of intravenous contrast. Multiplanar CT image reconstructions and MIPs were obtained to evaluate the vascular anatomy. Carotid stenosis measurements (when applicable) are obtained utilizing NASCET criteria, using the distal internal carotid diameter as the denominator. CONTRAST:  91m OMNIPAQUE IOHEXOL 350 MG/ML SOLN COMPARISON:  Brain MRI 12/15/2018 FINDINGS: CTA NECK FINDINGS SKELETON: C5-7 ACDF OTHER NECK: Normal pharynx, larynx and major salivary glands. No cervical lymphadenopathy. Unremarkable thyroid gland. UPPER CHEST: No pneumothorax or pleural effusion. No nodules or masses. AORTIC ARCH: There is no calcific atherosclerosis of the aortic arch. There is no aneurysm, dissection or hemodynamically significant stenosis of the visualized portion of the aorta. Normal variant aortic arch branching pattern with the brachiocephalic and left common carotid arteries sharing a common origin. The visualized proximal subclavian arteries are widely patent. RIGHT CAROTID SYSTEM: Normal without aneurysm, dissection or stenosis. LEFT CAROTID SYSTEM: Normal without aneurysm, dissection or stenosis. VERTEBRAL ARTERIES: Left dominant configuration. The  right vertebral artery is occluded below the C6 level and is irregular along the V2 segment. The V3 and V4 segments are normal. The left vertebral artery is normal. CTA HEAD FINDINGS POSTERIOR CIRCULATION: --Vertebral arteries: Normal V4 segments. --Posterior inferior cerebellar arteries (PICA): Patent origins from the vertebral arteries. --Anterior inferior cerebellar arteries (AICA): Patent origins from the basilar artery. --Basilar artery: Normal. --Superior cerebellar arteries: Normal.  --Posterior cerebral arteries: Normal. Both originate from the basilar artery. Posterior communicating arteries (p-comm) are diminutive or absent. ANTERIOR CIRCULATION: --Intracranial internal carotid arteries: Normal. --Anterior cerebral arteries (ACA): Normal. Both A1 segments are present. Patent anterior communicating artery (a-comm). --Middle cerebral arteries (MCA): Normal. VENOUS SINUSES: As permitted by contrast timing, patent. ANATOMIC VARIANTS: None Review of the MIP images confirms the above findings. IMPRESSION: 1. No intracranial arterial occlusion or high-grade stenosis. 2. 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 This likely indicates blunt cerebrovascular injury at the time of previous trauma. The distal right vertebral artery is patent to the vertebrobasilar confluence. 3. No specific finding to account for right-sided facial droop or numbness. Electronically Signed   By: Ulyses Jarred M.D.   On: 12/15/2018 20:32   Ct Head Wo Contrast  Result Date: 12/15/2018 CLINICAL DATA:  48 year old female with history of right-sided facial numbness and difficulty moving her right arm early this morning. EXAM: CT HEAD WITHOUT CONTRAST TECHNIQUE: Contiguous axial images were obtained from the base of the skull through the vertex without intravenous contrast. COMPARISON:  No priors. FINDINGS: Brain: No evidence of acute infarction, hemorrhage, hydrocephalus, extra-axial collection or mass lesion/mass effect. Vascular: No hyperdense vessel or unexpected calcification. Skull: Normal. Negative for fracture or focal lesion. Sinuses/Orbits: No acute finding. Other: None. IMPRESSION: 1. No acute intracranial abnormalities. The appearance of the brain is normal. Electronically Signed   By: Vinnie Langton M.D.   On: 12/15/2018 15:16   Ct Angio Neck W Or Wo Contrast  Result Date: 12/15/2018 CLINICAL DATA:  Right-sided facial numbness and decreased range of motion  to the right leg. EXAM: CT ANGIOGRAPHY HEAD AND NECK TECHNIQUE: Multidetector CT imaging of the head and neck was performed using the standard protocol during bolus administration of intravenous contrast. Multiplanar CT image reconstructions and MIPs were obtained to evaluate the vascular anatomy. Carotid stenosis measurements (when applicable) are obtained utilizing NASCET criteria, using the distal internal carotid diameter as the denominator. CONTRAST:  67m OMNIPAQUE IOHEXOL 350 MG/ML SOLN COMPARISON:  Brain MRI 12/15/2018 FINDINGS: CTA NECK FINDINGS SKELETON: C5-7 ACDF OTHER NECK: Normal pharynx, larynx and major salivary glands. No cervical lymphadenopathy. Unremarkable thyroid gland. UPPER CHEST: No pneumothorax or pleural effusion. No nodules or masses. AORTIC ARCH: There is no calcific atherosclerosis of the aortic arch. There is no aneurysm, dissection or hemodynamically significant stenosis of the visualized portion of the aorta. Normal variant aortic arch branching pattern with the brachiocephalic and left common carotid arteries sharing a common origin. The visualized proximal subclavian arteries are widely patent. RIGHT CAROTID SYSTEM: Normal without aneurysm, dissection or stenosis. LEFT CAROTID SYSTEM: Normal without aneurysm, dissection or stenosis. VERTEBRAL ARTERIES: Left dominant configuration. The right vertebral artery is occluded below the C6 level and is irregular along the V2 segment. The V3 and V4 segments are normal. The left vertebral artery is normal. CTA HEAD FINDINGS POSTERIOR CIRCULATION: --Vertebral arteries: Normal V4 segments. --Posterior inferior cerebellar arteries (PICA): Patent origins from the vertebral arteries. --Anterior inferior cerebellar arteries (AICA): Patent origins from the basilar  artery. --Basilar artery: Normal. --Superior cerebellar arteries: Normal. --Posterior cerebral arteries: Normal. Both originate from the basilar artery. Posterior communicating arteries  (p-comm) are diminutive or absent. ANTERIOR CIRCULATION: --Intracranial internal carotid arteries: Normal. --Anterior cerebral arteries (ACA): Normal. Both A1 segments are present. Patent anterior communicating artery (a-comm). --Middle cerebral arteries (MCA): Normal. VENOUS SINUSES: As permitted by contrast timing, patent. ANATOMIC VARIANTS: None Review of the MIP images confirms the above findings. IMPRESSION: 1. No intracranial arterial occlusion or high-grade stenosis. 2. 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 This likely indicates blunt cerebrovascular injury at the time of previous trauma. The distal right vertebral artery is patent to the vertebrobasilar confluence. 3. No specific finding to account for right-sided facial droop or numbness. Electronically Signed   By: Ulyses Jarred M.D.   On: 12/15/2018 20:32   Mr Brain Wo Contrast  Result Date: 12/15/2018 CLINICAL DATA:  Right-sided weakness EXAM: MRI HEAD WITHOUT CONTRAST TECHNIQUE: Multiplanar, multiecho pulse sequences of the brain and surrounding structures were obtained without intravenous contrast. COMPARISON:  Head CT 12/15/2018 Brain MRI 02/28/2017 FINDINGS: BRAIN: There is no acute infarct, acute hemorrhage or extra-axial collection. Scattered foci of hyperintense T2-weighted signal within the white matter in a nonspecific pattern that may be seen in the context of migraine headaches, but is also seen in asymptomatic patients. The distribution is unchanged. The cerebral and cerebellar volume are age-appropriate. There is no hydrocephalus. The midline structures are normal. VASCULAR: The major intracranial arterial and venous sinus flow voids are normal. There is a single focus of chronic microhemorrhage in the left parietal lobe, unchanged. SKULL AND UPPER CERVICAL SPINE: Calvarial bone marrow signal is normal. There is no skull base mass. The visualized upper cervical spine and soft tissues  are normal. SINUSES/ORBITS: There are no fluid levels or advanced mucosal thickening. The mastoid air cells and middle ear cavities are free of fluid. The orbits are normal. IMPRESSION: 1. No acute intracranial abnormality. 2. Unchanged scattered foci of hyperintense T2-weighted signal within the white matter. These are nonspecific and may be seen in the setting of migraine headaches, but are also seen in asymptomatic patients. Electronically Signed   By: Ulyses Jarred M.D.   On: 12/15/2018 20:17    PHYSICAL EXAM  Temp:  [97.5 F (36.4 C)-98.1 F (36.7 C)] 97.9 F (36.6 C) (10/05 1647) Pulse Rate:  [80-94] 94 (10/05 1647) Resp:  [12-24] 18 (10/05 1647) BP: (95-113)/(50-71) 99/59 (10/05 1647) SpO2:  [93 %-98 %] 95 % (10/05 1647) Weight:  [81.6 kg] 81.6 kg (10/05 1200)  General - Well nourished, well developed, in no apparent distress.  Ophthalmologic - fundi not visualized due to noncooperation.  Cardiovascular - Regular rhythm and rate.  Mental Status -  Level of arousal and orientation to time, place, and person were intact. Language including expression, naming, repetition, comprehension was assessed and found intact. Attention span and concentration were normal. Recent and remote memory were intact. Fund of Knowledge was assessed and was intact.  Cranial Nerves II - XII - II - Visual field intact OU. III, IV, VI - Extraocular movements intact. V - Facial sensation intact bilaterally. VII - Facial movement intact bilaterally. VIII - Hearing & vestibular intact bilaterally X - Palate elevates symmetrically. XI - Chin turning & shoulder shrug intact bilaterally. XII - Tongue protrusion intact.  Motor Strength - The patient's strength was normal in all extremities and pronator drift was absent.  Bulk was normal and fasciculations  were absent.   Motor Tone - Muscle tone was assessed at the neck and appendages and was normal.  Reflexes - The patient's reflexes were symmetrical  in all extremities and she had no pathological reflexes.  Sensory - Light touch, temperature/pinprick were assessed and were symmetrical.    Coordination - The patient had normal movements in the hands and feet with no ataxia or dysmetria.  Tremor was absent.  Gait and Station - deferred.   ASSESSMENT/PLAN [name] is a 48 y.o. female with history of sjogrens disease, anemia, migraine with recent C-Spine surgery presenting with facial numbness, right facial droop and right side weakness numbness followed by HA.   Complicated migraine most likely given recent cervicogenic HA - less likely TIA given no significant risk factors, although can not be ruled out completely  CT head No acute abnormality.  MRI  No acute abnormality.  CT A head & neck no stenosis/occlusion. R VA occlusion at C6 with distal recon, level of repaired fx. No etiology of R face droop  Transcranial Doppler no apparent PFO   2D Echo EF 55-60%. No source of embolus   LDL 82  HgbA1c 4.8  VB12, TSH WNL   Hypercoagulable labs pending   Lovenox 40 mg sq daily for VTE prophylaxis  No antithrombotic prior to admission, now on aspirin 325 mg daily. Recommend ASA 83m on discharge.  Therapy recommendations:  none   Disposition:  pending   Cervicogenic migraine  History of migraine as a younger age (89s  Recent cervical disc herniation and C-spine surgery  Followed by cervicogenic headache, daily  Tylenol as needed  Likely the cause of current symptoms  Other Stroke Risk Factors  ETOH use, advised to drink no more than 1 drink(s) a day  Other Active Problems  Sjogren's Disorder - labs pending, CRP and ESR within normal limits - continue Plaquenil  Hospital day # 0  Neurology will sign off. Please call with questions. [facility] Thanks for the consult.  JRosalin Hawking MD PhD Stroke Neurology 12/16/2018 6:50 PM    To contact Stroke  Continuity provider, please refer to [email or web address] After hours, contact General Neurology

## 2018-12-16 NOTE — Progress Notes (Signed)
Subjective: Patient examined at bedside this morning. No symptoms of right-sided weakness this morning. She reports persistent headache that is similar in character to her regular headaches. She is otherwise stable.   Objective: Vital signs in last 24 hours: Vitals:   12/16/18 0840 12/16/18 0900 12/16/18 1027 12/16/18 1213  BP:  100/65 (!) 102/50 106/67  Pulse: 87 89 90 86  Resp: 18 20 20 18   Temp:   98.1 F (36.7 C) (!) 97.5 F (36.4 C)  TempSrc:   Oral Oral  SpO2: 95% 96% 96% 97%   Physical exam: General: Well-appearing female. Not in acute distress.  Neck: In a soft collar  CV: RRR S1S2 without murmurs.  Pulm: CTAB. No wheezing or crackles.  Neuro: 5/5 strength in lower extremities. CN II-XII grossly intact.   Lab Results: CBC Latest Ref Rng & Units 12/16/2018 12/15/2018 05/30/2016  WBC 4.0 - 10.5 K/uL 7.7 7.5 8.9  Hemoglobin 12.0 - 15.0 g/dL 12.2 12.8 13.6  Hematocrit 36.0 - 46.0 % 37.5 39.3 41.1  Platelets 150 - 400 K/uL 327 349 253.0   CMP Latest Ref Rng & Units 12/16/2018 12/15/2018 02/12/2017  Glucose 70 - 99 mg/dL 90 103(H) -  BUN 6 - 20 mg/dL 9 6 -  Creatinine 0.44 - 1.00 mg/dL 0.78 0.82 -  Sodium 135 - 145 mmol/L 137 139 -  Potassium 3.5 - 5.1 mmol/L 3.7 4.1 -  Chloride 98 - 111 mmol/L 104 105 -  CO2 22 - 32 mmol/L 23 25 -  Calcium 8.9 - 10.3 mg/dL 9.5 9.4 -  Total Protein 6.5 - 8.1 g/dL 6.7 7.4 6.8  Total Bilirubin 0.3 - 1.2 mg/dL 0.5 0.5 -  Alkaline Phos 38 - 126 U/L 50 50 -  AST 15 - 41 U/L 24 28 -  ALT 0 - 44 U/L 24 23 -   Lipid Panel     Component Value Date/Time   CHOL 131 12/15/2018 2148   TRIG 41 12/15/2018 2148   HDL 41 12/15/2018 2148   CHOLHDL 3.2 12/15/2018 2148   VLDL 8 12/15/2018 2148   LDLCALC 82 12/15/2018 2148   Studies/Results: CT head w/o contrast: Normal brain appearance w/o intracranial abnormalities.   MR brain w/o contrast: No intracranial abnormality. A nonspecific foci of hyperintense signal in white matter that may be seen in  the setting of migraine headaches.   CTA head and neck w/o contrast: No intracranial arterial occulusion or high-grade stenosis. Right vertebral artery occluded at C6, the level of the repaired fracture. This likely indicates blunt cerebrovascular injury at the time of previous trauma. The distal right vertebral artery is patent to the vertebrobasilar confluence. Findings not specific for right-sided facial droop or numbness.   Echo: Normal LV function and size with LVEF 55-60%. Normal sized RA and LA. Normal mitral and tricuspid valve structure with out evidence of regurgitation.   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   TIA: Right sided weakness with right leg weakness and right facial droop that resolved after 30 minutes. CT and MR without ischemic changes or signs of stroke. Echo with normal heart function with EF of 55-60%. Lipid panel and A1C within normal range. Started on ASA and statin. Weakness likely from TIA v atypical migraine. Patient continues to have headache this  morning. Migraine cocktail ordered. Neurology consulted and agrees with discharge today.   -- f/u with transcranial doppler u/s and DVT u/s  Sjogren's Disorder: -- cont. Plaquenil  -- cont. systane eye drops for dry eyes   Diet: Soft diet DVT: Lovenox 40 mg Dispo: Discharge likely today.   This is a Psychologist, occupational Note.  The care of the patient was discussed with Dr. Ephriam Knuckles and the assessment and plan formulated with their assistance.  Please see their attached note for official documentation of the daily encounter.   LOS: 0 days   Charlene Barrera, Medical Student 12/16/2018, 1:51 PM

## 2018-12-16 NOTE — Progress Notes (Signed)
  Echocardiogram 2D Echocardiogram has been performed.  Charlene Barrera 12/16/2018, 9:18 AM

## 2018-12-16 NOTE — Discharge Summary (Signed)
Name: Charlene Barrera MRN: [id number] DOB: 01/14/1971 48 y.o. PCP: Maximiano Coss, MD  Date of Admission: 12/15/2018  2:02 PM Date of Discharge: 12/16/2018 Attending Physician: No att. providers found  Discharge Diagnosis: 1. TIA  Discharge Medications: Allergies as of 12/16/2018      Reactions   Other Diarrhea   Reaction to lettuce   Pineapple Swelling, Other (See Comments)   Lip swelling, mouth ulcers   Wheat Bran Diarrhea, Nausea And Vomiting   Yeast-related Products Nausea And Vomiting, Other (See Comments)   Body aches (flu like symptoms)      Medication List    TAKE these medications   acetaminophen 500 MG tablet Commonly known as: TYLENOL Take 250 mg by mouth every 6 (six) hours as needed (pain).   CALCIUM-D PO Take 1 tablet by mouth at bedtime.   cholecalciferol 25 MCG (1000 UT) tablet Commonly known as: VITAMIN D3 Take 4,000 Units by mouth at bedtime.   cyclobenzaprine 10 MG tablet Commonly known as: FLEXERIL Take 10 mg by mouth 3 (three) times daily.   ferrous sulfate 325 (65 FE) MG tablet Take 325 mg by mouth at bedtime.   HYDROcodone-acetaminophen 5-325 MG tablet Commonly known as: NORCO/VICODIN Take 1-2 tablets by mouth every 4 (four) hours as needed (pain).   hydroxychloroquine 200 MG tablet Commonly known as: PLAQUENIL Take 200 mg by mouth at bedtime.   ibuprofen 200 MG tablet Commonly known as: ADVIL Take 200-400 mg by mouth every 4 (four) hours as needed (pain).   multivitamin with minerals Tabs tablet Take 1 tablet by mouth at bedtime.   PROBIOTIC ADVANCED PO Take 2 tablets by mouth at bedtime.   Systane Ultra 0.4-0.3 % Soln Generic drug: Polyethyl Glycol-Propyl Glycol Place 1 drop into both eyes 2 (two) times daily.   VITAMIN B-12 PO Take 1 tablet by mouth at bedtime.      Disposition and follow-up:   [name] was discharged from [facility] in Stable condition.  At the hospital follow up visit  please address:  1.  TIR:[id number] right-sided weakness that resolved on arrival to ED. MR and CT without evidence for stroke.   2.  Labs / imaging needed at time of follow-up: None  3.  Pending labs/ test needing follow-up: Transcranial doppler and lower extremity venous u/s  Follow-up Appointments: Follow-up Information    [facility] .   Specialty: Family Medicine       [facility] Follow up in 1 week(s).   Contact information: [address] [place] [address] [phone]       [facility]. Schedule an appointment as soon as possible for a visit in 4 week(s).   Specialty: Neurology Contact information: [address] [place] [address] [phone]        Follow up with PCP [facility] Course by problem list: 1. TIA: Woke up with right-sided weakness at 4 am on 10/04. Symptoms of right leg weakness and right facial droop resolved after 30 minutes when EMS arrived. Negative stroke screen. No symptoms in ED. CT head, CTA head and MR brain negative for ischemic changes. Started on ASA and statin therapy for secondary prevention.   Discharge Vitals:   BP (!) 99/59 (BP Location: Left Arm)   Pulse 94   Temp 97.9 F (36.6 C) (Oral)   Resp 18   Ht 5\' 7"  (1.702 m)   Wt  180 lb (81.6 kg)   SpO2 95%   BMI 28.19 kg/m   Pertinent Labs, Studies, and Procedures:  CT head w/o contrast: Normal brain appearance w/o intracranial abnormalities.   MR brain w/o contrast: No intracranial abnormality. A nonspecific foci of hyperintense signal in white matter that may be seen in the setting of migraine headaches.   CTA head and neck w/o contrast: No intracranial arterial occulusion or high-grade stenosis. Right vertebral artery occluded at C6, the level of the repaired fracture. This likely indicates blunt cerebrovascular injury at the time of  previous trauma. The distal right vertebral artery is patent to the vertebrobasilar confluence. Findings not specific for right-sided facial droop or numbness.   Echo: Normal LV function and size with LVEF 55-60%. Normal sized RA and LA. Normal mitral and tricuspid valve structure with out evidence of regurgitation.   Discharge Instructions: Discharge Instructions    Activity as tolerated - No restrictions   Complete by: As directed    Ambulatory referral to Neurology   Complete by: As directed    Follow up with stroke clinic NP (Jessica Vanschaick or Cecille Rubin, if both not available, consider Zachery Dauer, or A[facility]) at [facility] in about 4 weeks. Thanks.   Call MD for:  difficulty breathing, headache or visual disturbances   Complete by: As directed    Call MD for:  persistant dizziness or light-headedness   Complete by: As directed    Call MD for:  persistant nausea and vomiting   Complete by: As directed    Call MD for:  severe uncontrolled pain   Complete by: As directed    Call MD for:  temperature >100.4   Complete by: As directed    Diet general   Complete by: As directed    Discharge instructions   Complete by: As directed    Please follow up with the Dr. Erlinda Hong (neurology) as needed. Please also arrange for a hospital follow up appointment with the [facility] clinic within one week.    You were recently admitted to [facility] after an episode of transient ischemic attack. All the imaging of your brain and neck were negative for stroke.   Please call your doctor or 911 if you start having similar symptoms. Please follow up with your regular doctor for management of your medications.   We are adding a cholesterol lowering medication, atorvastatin, and aspirin to your medication regimen. This will help prevent similar future episodes.   It was a pleasure taking care of you. We are wishing you a speedy recovery.   Signed: Evaristo Bury, Medical Student 12/22/2018, 7:34  AM   Pager: [phone]

## 2018-12-16 NOTE — Discharge Summary (Addendum)
Name: Charlene Barrera MRN: [id number] DOB: February 09, 1971 48 y.o. PCP: Yvone Neu, MD  Date of Admission: 12/15/2018  2:02 PM Date of Discharge:  Attending Physician: Velna Ochs, MD  Discharge Diagnosis: 1. Complicated migraine   Discharge Medications: Allergies as of 12/16/2018      Reactions   Other Diarrhea   Reaction to lettuce   Pineapple Swelling, Other (See Comments)   Lip swelling, mouth ulcers   Wheat Bran Diarrhea, Nausea And Vomiting   Yeast-related Products Nausea And Vomiting, Other (See Comments)   Body aches (flu like symptoms)      Medication List    TAKE these medications   acetaminophen 500 MG tablet Commonly known as: TYLENOL Take 250 mg by mouth every 6 (six) hours as needed (pain).   CALCIUM-D PO Take 1 tablet by mouth at bedtime.   cholecalciferol 25 MCG (1000 UT) tablet Commonly known as: VITAMIN D3 Take 4,000 Units by mouth at bedtime.   cyclobenzaprine 10 MG tablet Commonly known as: FLEXERIL Take 10 mg by mouth 3 (three) times daily.   ferrous sulfate 325 (65 FE) MG tablet Take 325 mg by mouth at bedtime.   HYDROcodone-acetaminophen 5-325 MG tablet Commonly known as: NORCO/VICODIN Take 1-2 tablets by mouth every 4 (four) hours as needed (pain).   hydroxychloroquine 200 MG tablet Commonly known as: PLAQUENIL Take 200 mg by mouth at bedtime.   ibuprofen 200 MG tablet Commonly known as: ADVIL Take 200-400 mg by mouth every 4 (four) hours as needed (pain).   multivitamin with minerals Tabs tablet Take 1 tablet by mouth at bedtime.   PROBIOTIC ADVANCED PO Take 2 tablets by mouth at bedtime.   Systane Ultra 0.4-0.3 % Soln Generic drug: Polyethyl Glycol-Propyl Glycol Place 1 drop into both eyes 2 (two) times daily.   VITAMIN B-12 PO Take 1 tablet by mouth at bedtime.       Disposition and follow-up:   [name] was discharged from [facility] in Good condition.  At the hospital follow up  visit please address:  1.  Complicated Migraine. 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 MRI with findings compatible with migraine. She continues to complain of headache during her hospitalization that was treated with NSAIDs. Neurology consulted and suspects complicated migraine. Pt should be started on 81mg  asprin and neurology f/u 4w post discharge per neurology recs that were placed after d/c.  2.  Labs / imaging needed at time of follow-up: none.  3.  Pending labs/ test needing follow-up: Hypercoaguable antibodies. Transcranial doppler with bubble study.  Follow-up Appointments: Follow-up Information    Rosalin Hawking, MD Follow up in 4 week(s).   Specialty: Neurology Contact information: [address] [place] [address] [place] Hospital Course by problem list: [age] yo female with PMHX of anemia, Sjogren's, duodenitis, and cervical stenosis s/p surgery 9/24 who presented to ED for a 31min episode of right sided hemiplegia and worsening right facial droop. 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 Symptoms had resolved prior to ED arrival however due to concern for stroke, patient was admitted for further evaluation. Pt also symptoms consistent with cervicogenic headache. CT head, CTA head and neck, and MRI  brain were performed and found to be negative for findings consistent with acute stroke. MRI brain did comment on unchanged scattered foci within the white matter than can be seen in the setting of migraine headaches. No abnormal findings on echo. Transcranial doppler with bubble study performed and was pending at discharge. CBC, CMP, TSH, Lipids and A1C wnl.  Hypercoagulable antibodies pending. Patient experienced no  further episodes and neurology suspected complicated migraine etiology and low likelihood for TIA. She continued to complain of headaches while hospitalized treated with PRN NSAIDs. Patient discharged with recommendations to follow up with neurology and [facility].  Discharge Vitals:   BP (!) 99/59 (BP Location: Left Arm)    Pulse 94    Temp 97.9 F (36.6 C) (Oral)    Resp 18    Ht 5\' 7"  (1.702 m)    Wt 81.6 kg    SpO2 95%    BMI 28.19 kg/m   Pertinent Labs, Studies, and Procedures:  CT head, CTA head and neck, MRI brain found neg for findings consistent with acute stroke. MRI did comment on some stable scattered foci within the white matter that can be seen in the setting of migraine. Echo: Normal LV function and size with LVEF 55-60%. Normal sized RA and LA. Normal mitral and tricuspid valve structure with out evidence of regurgitation.Transcranial doppler with bubble study performed with read pending at discharge.  Discharge Instructions: Discharge Instructions    Activity as tolerated - No restrictions   Complete by: As directed    Call MD for:  difficulty breathing, headache or visual disturbances   Complete by: As directed    Call MD for:  persistant dizziness or light-headedness   Complete by: As directed    Call MD for:  persistant nausea and vomiting   Complete by: As directed    Call MD for:  severe uncontrolled pain   Complete by: As directed    Call MD for:  temperature >100.4   Complete by: As directed    Diet general   Complete by: As directed    Discharge instructions   Complete by: As directed    Please follow up with the Dr. (neurology) as needed. Please also arrange for a hospital follow up appointment with the [facility] clinic within one week.      Signed: [facility], MD 12/16/2018, 5:55 PM   Pager: [phone]

## 2018-12-16 NOTE — Evaluation (Signed)
Physical Therapy Evaluation and Discharge Patient Details Name: Charlene Barrera MRN: [id number] DOB: 1970/09/08 Today's Date: 12/16/2018   History of Present Illness  Pt is a 48 y/o female admitted secondary to RUE and RLE weakness. Symptoms resolved upon admission. MRI negative for acute abnormality. PMH includes Sjogren's disease, and mild facial droop at baseline.   Clinical Impression  Patient evaluated by Physical Therapy with no further acute PT needs identified. All education has been completed and the patient has no further questions. Pt at an independent to mod I level for all mobility. Pt with recent cervical surgery, so reviewed cervical precautions and generalized walking program. Educated about "BE FAST" acronym when recognizing CVA symptoms. Reports she feels she is at baseline and husband can assist as needed at home.  See below for any follow-up Physical Therapy or equipment needs. PT is signing off. Thank you for this referral. If needs change, please re-consult.      Follow Up Recommendations No PT follow up    Equipment Recommendations  None recommended by PT    Recommendations for Other Services       Precautions / Restrictions Precautions Precautions: Cervical Precaution Booklet Issued: No Precaution Comments: Pt with recent cervical surgery in September. Reviewed spinal precautions with pt.  Required Braces or Orthoses: Cervical Brace Cervical Brace: Soft collar Restrictions Weight Bearing Restrictions: No      Mobility  Bed Mobility Overal bed mobility: Independent             General bed mobility comments: Followed precautions appropriately during bed mobility.   Transfers Overall transfer level: Independent                  Ambulation/Gait Ambulation/Gait assistance: Modified independent (Device/Increase time) Gait Distance (Feet): 200 Feet Assistive device: None Gait Pattern/deviations: Step-through pattern Gait velocity: Mildly  decreased   General Gait Details: Guarded gait secondary to recent spinal surgery, however, overall steady. No LOB noted and pt reports being at her baseline for ambulation.   Stairs            Wheelchair Mobility    Modified Rankin (Stroke Patients Only)       Balance Overall balance assessment: No apparent balance deficits (not formally assessed)                                           Pertinent Vitals/Pain Pain Assessment: No/denies pain    Home Living Family/patient expects to be discharged to:: Private residence Living Arrangements: Spouse/significant other Available Help at Discharge: Family;Available 24 hours/day Type of Home: House Home Access: Stairs to enter Entrance Stairs-Rails: None Entrance Stairs-Number of Steps: 1(porch step) Home Layout: Two level;Able to live on main level with bedroom/bathroom Home Equipment: None      Prior Function Level of Independence: Independent               Hand Dominance        Extremity/Trunk Assessment   Upper Extremity Assessment Upper Extremity Assessment: Overall WFL for tasks assessed    Lower Extremity Assessment Lower Extremity Assessment: Overall WFL for tasks assessed    Cervical / Trunk Assessment Cervical / Trunk Assessment: Other exceptions Cervical / Trunk Exceptions: recent cervical surgery   Communication   Communication: No difficulties  Cognition Arousal/Alertness: Awake/alert Behavior During Therapy: WFL for tasks assessed/performed Overall Cognitive Status: Within Functional Limits for tasks assessed  General Comments General comments (skin integrity, edema, etc.): Educated about "BE FAST" acronym for recognizing CVA symptoms.     Exercises     Assessment/Plan    PT Assessment Patent does not need any further PT services  PT Problem List         PT Treatment Interventions      PT Goals (Current  goals can be found in the Care Plan section)  Acute Rehab PT Goals Patient Stated Goal: to go home PT Goal Formulation: With patient Time For Goal Achievement: 12/16/18 Potential to Achieve Goals: Good    Frequency     Barriers to discharge        Co-evaluation               AM-PAC PT "6 Clicks" Mobility  Outcome Measure Help needed turning from your back to your side while in a flat bed without using bedrails?: None Help needed moving from lying on your back to sitting on the side of a flat bed without using bedrails?: None Help needed moving to and from a bed to a chair (including a wheelchair)?: None Help needed standing up from a chair using your arms (e.g., wheelchair or bedside chair)?: None Help needed to walk in hospital room?: None Help needed climbing 3-5 steps with a railing? : A Little 6 Click Score: 23    End of Session Equipment Utilized During Treatment: Cervical collar Activity Tolerance: Patient tolerated treatment well Patient left: in bed;with call bell/phone within reach;with family/visitor present Nurse Communication: Mobility status PT Visit Diagnosis: Other symptoms and signs involving the nervous system (F35.456)    Time: 2563-8937 PT Time Calculation (min) (ACUTE ONLY): 10 min   Charges:   PT Evaluation $PT Eval Low Complexity: [place], PT, DPT  Acute Rehabilitation Services  Pager: [phone] Office: [phone]   Rudean Hitt 12/16/2018, 11:21 AM

## 2018-12-16 NOTE — Progress Notes (Signed)
Patient and spouse has been provided discharge instructions and they both verbalize understanding of discharge instructions.

## 2018-12-16 NOTE — Progress Notes (Signed)
Patient resting and awake. Spouse at bedside. Patient has not acute distress or complaints at this time. Awaiting follow up from MD regarding results and possbility of discharge.

## 2018-12-16 NOTE — ED Notes (Signed)
Echo at bedside

## 2018-12-16 NOTE — ED Notes (Signed)
Tele ordered bfast 

## 2018-12-16 NOTE — ED Notes (Signed)
pts husband  Martin Majestic to sleep in his car he will return

## 2018-12-16 NOTE — Plan of Care (Signed)
Pain managed appropriately with prescribed medications

## 2018-12-16 NOTE — Progress Notes (Signed)
OT Screen Note  Patient Details Name: Nayeliz Hipp MRN: [id number] DOB: 10/03/1970   Cancelled Treatment:    Reason Eval/Treat Not Completed: OT screened, no needs identified, will sign off . PT [place] notified OT of no needs at baseline  Richelle Ito, OTR/L  Acute Rehabilitation Services Pager: [phone] Office: [phone] .  12/16/2018, 11:46 AM

## 2018-12-17 LAB — BETA-2-GLYCOPROTEIN I ABS, IGG/M/A
Beta-2 Glyco I IgG: <9 GPI IgG units (ref 0–20)
Beta-2-Glycoprotein I IgA: <9 GPI IgA units (ref 0–25)
Beta-2-Glycoprotein I IgM: <9 GPI IgM units (ref 0–32)

## 2018-12-17 LAB — SJOGRENS SYNDROME-A EXTRACTABLE NUCLEAR ANTIBODY: SSA (Ro) (ENA) Antibody, IgG: <0.2 AI (ref 0.0–0.9)

## 2018-12-17 LAB — HOMOCYSTEINE: Homocysteine: 7.6 umol/L (ref 0.0–14.5)

## 2018-12-17 LAB — ANTINUCLEAR ANTIBODIES, IFA: ANA Ab, IFA: NEGATIVE

## 2018-12-17 LAB — LUPUS ANTICOAGULANT PANEL
DRVVT: 38.7 s (ref 0.0–47.0)
PTT Lupus Anticoagulant: 39.7 s (ref 0.0–51.9)

## 2018-12-17 LAB — SJOGRENS SYNDROME-B EXTRACTABLE NUCLEAR ANTIBODY: SSB (La) (ENA) Antibody, IgG: <0.2 AI (ref 0.0–0.9)

## 2018-12-18 LAB — CARDIOLIPIN ANTIBODIES, IGG, IGM, IGA
Anticardiolipin IgA: <9 APL U/mL (ref 0–11)
Anticardiolipin IgG: <9 GPL U/mL (ref 0–14)
Anticardiolipin IgM: <9 MPL U/mL (ref 0–12)

## 2019-01-13 ENCOUNTER — Inpatient Hospital Stay: Payer: BLUE CROSS/BLUE SHIELD | Admitting: Adult Health

## 2019-02-18 ENCOUNTER — Encounter: Payer: Self-pay | Admitting: Adult Health

## 2019-02-18 ENCOUNTER — Other Ambulatory Visit: Payer: Self-pay

## 2019-02-18 ENCOUNTER — Ambulatory Visit: Payer: BC Managed Care – PPO | Admitting: Adult Health

## 2019-02-18 ENCOUNTER — Encounter

## 2019-02-18 VITALS — BP 102/70 | HR 79 | Temp 98.3°F | Ht 67.0 in | Wt 187.0 lb

## 2019-02-18 DIAGNOSIS — M62838 Other muscle spasm: Secondary | ICD-10-CM

## 2019-02-18 DIAGNOSIS — R519 Headache, unspecified: Secondary | ICD-10-CM

## 2019-02-18 DIAGNOSIS — G4486 Cervicogenic headache: Secondary | ICD-10-CM

## 2019-02-18 MED ORDER — METHOCARBAMOL 750 MG PO TABS: 750.0000 mg | ORAL_TABLET | Freq: Four times a day (QID) | ORAL | 0 refills | Status: AC | PRN

## 2019-02-18 NOTE — Progress Notes (Signed)
I agree with the above plan 

## 2019-02-18 NOTE — Patient Instructions (Signed)
Your Plan:  Your ongoing headache/neck pain likely secondary to muscle tension and increased stiffness.  Due to limited benefit with use of Flexeril, recommend trialing Robaxin 750 mg 4 times daily as needed over the next week.  Would also recommend restart of dry needling and moist heat  Follow-up with neurosurgery next Wednesday  Continue aspirin 81 mg for stroke prevention   Follow-up in 4 months or call earlier if needed     Thank you for coming to see [place] at [facility]. I hope we have been able to provide you high quality care today.  You may receive a patient satisfaction survey over the next few weeks. We would appreciate your feedback and comments so that we may continue to improve ourselves and the health of our patients.

## 2019-02-18 NOTE — Progress Notes (Signed)
[facility] [address] [place] [id number] [phone]       HOSPITAL FOLLOW UP NOTE  Ms. Charlene BattenSusan Barrera Date of Birth:  10/06/1970 Medical Record Number:  [id number]   Reason for Referral:  hospital follow up    CHIEF COMPLAINT:  Chief Complaint  Patient presents with  . Follow-up    Room 9, alone. Doing better. Still gets headaches and has tightness of neck. Gets Dizzy sometimes    HPI: Charlene Barrera being seen today for in office hospital follow-up regarding likely complicated migraine versus TIA on 12/15/2018.  History obtained from patient and chart review. Reviewed all radiology images and labs personally.  Charlene Barrera is a 48 y.o. female with history of sjogrens disease, anemia, migraine with recent C-Spine surgery  who presented on 12/15/2018 with facial numbness, right facial droop and right side weakness numbness followed by HA.    Stroke work-up largely unremarkable in regards to acute stroke and felt symptoms likely related to complicated migraine most likely given recent cervicogenic headache with recent C-spine surgery and less likely TIA given no significant risk factors although cannot be completely ruled out.  CT head and MRI unremarkable.  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  Transcranial Doppler negative for PFO.  2D echo unremarkable.  Recommended initiating aspirin 81 mg daily for stroke prevention.  No evidence or history of DM with A1c 4.8.  No evidence or history of HLD with LDL 82.  BP stable without history of HTN.  Hypercoagulable work-up unremarkable.  Vitamin B12 and TSH within normal limits.  History of Sjogren's disorder currently on Plaquenil. History of migraines which started in high school and continued throughout college years but most prominent in her 30s.  Per notes, she has not had recent headache/migraine for some time until recent  neck injury.  With prior migraines, she would experience visual aura but no history of numbness, arm or leg weakness.  Discharged home in stable condition.  Charlene Barrera is a 48 year old female who is being seen today, 02/18/2019, for hospital follow-up.  She denies reoccurring weakness or numbness or recent migraines.  She has been experiencing continued headaches accompanied by neck stiffness/pain on right side with sensation radiating down left sternocleidomastoid into her upper trapezius.  Occasionally will have radiating pain up occipital nerve root.  Pain can worsen with specific movements such as bending down from waist to pick something up or if she tries to sleep on her right side.  She was prescribed Flexeril by neurosurgery and patient unable to state if beneficial as it caused great amount of fatigue.  Prior to her cervical procedure, she was receiving dry needling in her left trapezius with benefit but has not returned at this time.  She does have follow-up with neurosurgery next Wednesday.  She has continued on aspirin 81 mg daily.  Blood pressure today 102/70.  No further concerns at this time.     ROS:   14 system review of systems performed and negative with exception of pain, headache  PMH:  Past Medical History:  Diagnosis Date  . Anemia   . Diarrhea   . Duodenitis   . Sjogren's disease (HCC)     PSH:  Past Surgical History:  Procedure Laterality Date  . APPENDECTOMY    . CESAREAN SECTION    . FACIAL COSMETIC SURGERY    . HERNIA REPAIR    .  NECK SURGERY    . SHOULDER ARTHROSCOPY      Social History:  Social History   Socioeconomic History  . Marital status: Married    Spouse name: Not on file  . Number of children: Not on file  . Years of education: Not on file  . Highest education level: Not on file  Occupational History  . Not on file  Social Needs  . Financial resource strain: Not on file  . Food insecurity    Worry: Not on file    Inability: Not on  file  . Transportation needs    Medical: Not on file    Non-medical: Not on file  Tobacco Use  . Smoking status: Never Smoker  . Smokeless tobacco: Never Used  Substance and Sexual Activity  . Alcohol use: Yes    Comment: occasional  . Drug use: No  . Sexual activity: Not on file  Lifestyle  . Physical activity    Days per week: Not on file    Minutes per session: Not on file  . Stress: Not on file  Relationships  . Social Musician on phone: Not on file    Gets together: Not on file    Attends religious service: Not on file    Active member of club or organization: Not on file    Attends meetings of clubs or organizations: Not on file    Relationship status: Not on file  . Intimate partner violence    Fear of current or ex partner: Not on file    Emotionally abused: Not on file    Physically abused: Not on file    Forced sexual activity: Not on file  Other Topics Concern  . Not on file  Social History Narrative  . Not on file    Family History:  Family History  Problem Relation Age of Onset  . Breast cancer Mother   . Breast cancer Paternal Aunt     Medications:   Current Outpatient Medications on File Prior to Visit  Medication Sig Dispense Refill  . acetaminophen (TYLENOL) 500 MG tablet Take 250 mg by mouth every 6 (six) hours as needed (pain).    Marland Kitchen aspirin EC 81 MG tablet Take 81 mg by mouth daily.    . Calcium Carbonate-Vitamin D (CALCIUM-D PO) Take 1 tablet by mouth at bedtime.    . cholecalciferol (VITAMIN D3) 25 MCG (1000 UT) tablet Take 4,000 Units by mouth at bedtime.    . Cyanocobalamin (VITAMIN B-12 PO) Take 1 tablet by mouth at bedtime.    . ferrous sulfate 325 (65 FE) MG tablet Take 325 mg by mouth at bedtime.    . hydroxychloroquine (PLAQUENIL) 200 MG tablet Take 200 mg by mouth at bedtime.     Marland Kitchen ibuprofen (ADVIL) 200 MG tablet Take 200-400 mg by mouth every 4 (four) hours as needed (pain).    . Multiple Vitamin (MULTIVITAMIN WITH  MINERALS) TABS tablet Take 1 tablet by mouth at bedtime.    Bertram Gala Glycol-Propyl Glycol (SYSTANE ULTRA) 0.4-0.3 % SOLN Place 1 drop into both eyes 2 (two) times daily.    . Probiotic Product (PROBIOTIC ADVANCED PO) Take 2 tablets by mouth at bedtime.      No current facility-administered medications on file prior to visit.     Allergies:   Allergies  Allergen Reactions  . Other Diarrhea    Reaction to lettuce  . Pineapple Swelling and Other (See Comments)  Lip swelling, mouth ulcers  . Wheat Bran Diarrhea and Nausea And Vomiting  . Yeast-Related Products Nausea And Vomiting and Other (See Comments)    Body aches (flu like symptoms)     Physical Exam  Vitals:   02/18/19 1504  BP: 102/70  Pulse: 79  Temp: 98.3 F (36.8 C)  Weight: 187 lb (84.8 kg)  Height: 5\' 7"  (1.702 m)   Body mass index is 29.29 kg/m. No exam data present  General: well developed, well nourished,  pleasant middle-age Caucasian female, seated, in no evident distress Head: head normocephalic and atraumatic.   Neck: supple with no carotid or supraclavicular bruits Cardiovascular: regular rate and rhythm, no murmurs Musculoskeletal: no deformity; right upper trapezius tightness Skin:  no rash/petichiae Vascular:  Normal pulses all extremities   Neurologic Exam Mental Status: Awake and fully alert. Oriented to place and time. Recent and remote memory intact. Attention span, concentration and fund of knowledge appropriate. Mood and affect appropriate.  Cranial Nerves: Fundoscopic exam reveals sharp disc margins. Pupils equal, briskly reactive to light. Extraocular movements full without nystagmus. Visual fields full to confrontation. Hearing intact. Facial sensation intact.  Chronic right-sided facial droop (injury at birth).  Denies occipital pain with pressure Motor: Normal bulk and tone. Normal strength in all tested extremity muscles. Sensory.: intact to touch , pinprick , position and vibratory  sensation.  Coordination: Rapid alternating movements normal in all extremities. Finger-to-nose and heel-to-shin performed accurately bilaterally. Gait and Station: Arises from chair without difficulty. Stance is normal. Gait demonstrates normal stride length and balance Reflexes: 1+ and symmetric. Toes downgoing.       ASSESSMENT: Eyva Califano is a 48 y.o. year old female presented with facial numbness, right facial droop and right-sided weakness/numbness followed by headache with recent C-spine surgery on 12/15/2018 with stroke work-up largely unremarkable and likely complicated migraine less likely TIA with no significant risk factors although cannot be completely ruled out. Vascular risk factors include Sjogren's disease and migraine.  She has not experienced reoccurring right-sided weakness/numbness but has continued to experience right-sided headaches but mainly located lower occipital and to upper trapezius area appearing to be mostly tension related    PLAN:  1. Possible TIA: Continue aspirin 81 mg daily for secondary stroke prevention.  No indication/need for statin therapy at this time.  Maintain strict control of hypertension with blood pressure goal below 130/90, diabetes with hemoglobin A1c goal below 6.5% and cholesterol with LDL cholesterol (bad cholesterol) goal below 70 mg/dL.  I also advised the patient to eat a healthy diet with plenty of whole grains, cereals, fruits and vegetables, exercise regularly with at least 30 minutes of continuous activity daily and maintain ideal body weight. 2. Cervicogenic headache: Appears more related to right trapezius tension.  Provided Robaxin 750 mg 4 times daily as needed for 1 week to assist with muscle tension/spasm as this type of muscle relaxant has been shown to not be as sedating as Flexeril which she had difficulty tolerating.  She will follow up with her neurosurgery in 1 week and will discuss if muscle relaxers need to be continued.   Also recommended restarting dry needling which she gained benefit with prior and use of moist heat   Follow up in 4 months or call earlier if needed   Greater than 50% of time during this 45 minute visit was spent on counseling, explanation of diagnosis of possible TIA versus complicated migraine, discussion regarding continued headaches likely cervicogenic with muscle tension, planning of further  management along with potential future management, and discussion with patient answering all questions to satisfaction    Frann Rider, [facility]  [facility] [address] [place], [place] [address]  Phone [phone] Fax [phone] Note: This document was prepared with digital dictation and possible smart phrase technology. Any transcriptional errors that result from this process are unintentional.

## 2019-06-19 ENCOUNTER — Ambulatory Visit: Payer: BC Managed Care – PPO | Admitting: Adult Health

## 2020-09-15 IMAGING — MR MR HEAD W/O CM
12 of 13 series · 44 of 48 positions shown · non-contrast
Comparison: Head CT 12/15/2018

Brain MRI 02/28/2017

CLINICAL DATA: Right-sided weakness

EXAM:
MRI HEAD WITHOUT CONTRAST
TECHNIQUE: Multiplanar, multiecho pulse sequences of the brain and surrounding
structures were obtained without intravenous contrast.

[Series 9: DWI · axial · 3.0mm · 0.88mm/px · z∈[-157,-5]mm · 8 of 104 slices shown (1 of 4)]
[im 1/104]
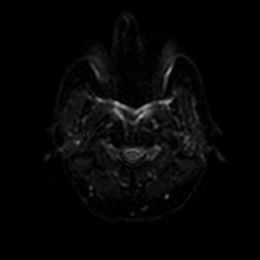
[im 15/104]
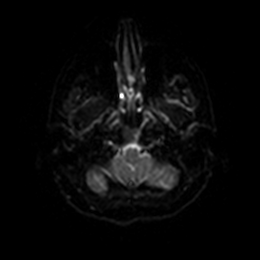
[im 30/104]
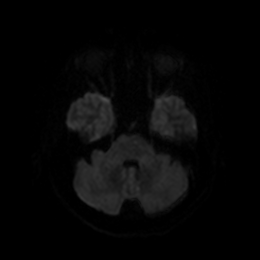
[im 45/104]
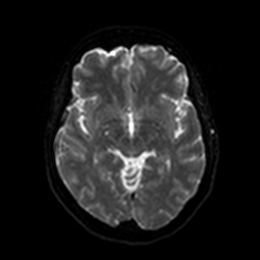
[im 59/104]
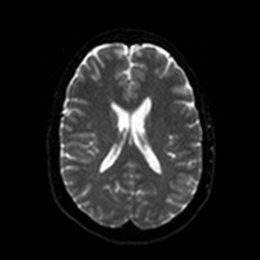
[im 74/104]
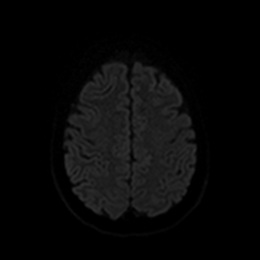
[im 89/104]
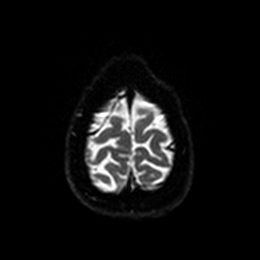
[im 104/104]
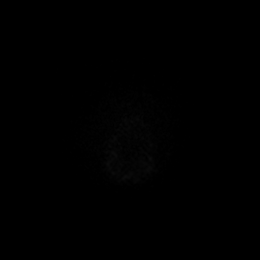

[Series 10: DWI · axial · 3.0mm · 0.88mm/px · z∈[-157,-5]mm · 4 of 50 slices shown (2 of 4)]
[im 1/50]
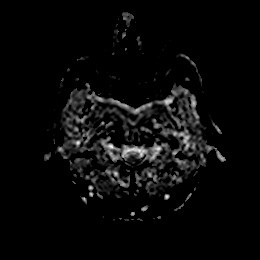
[im 17/50]
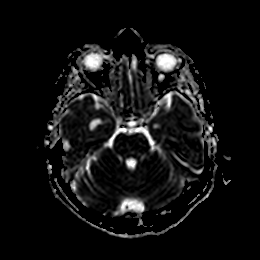
[im 33/50]
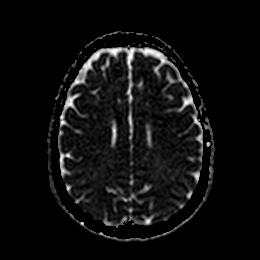
[im 50/50]
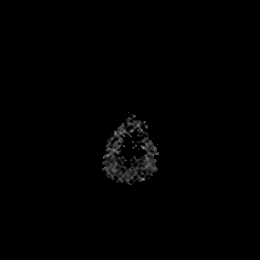

[Series 11: DWI · coronal · 4.0mm · 0.88mm/px · 6 of 74 slices shown (3 of 4)]
[im 1/74]
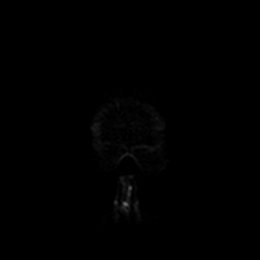
[im 15/74]
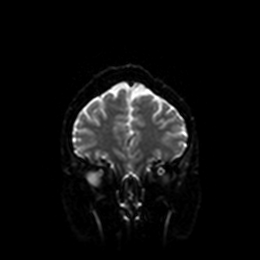
[im 30/74]
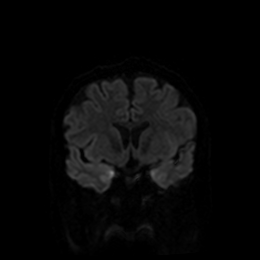
[im 44/74]
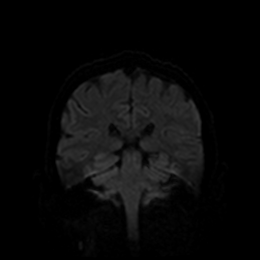
[im 59/74]
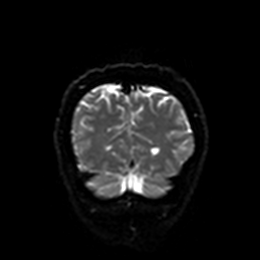
[im 74/74]
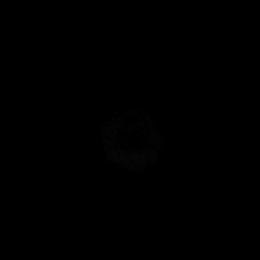

[Series 12: DWI · coronal · 4.0mm · 0.88mm/px · 3 of 37 slices shown (4 of 4)]
[im 1/37]
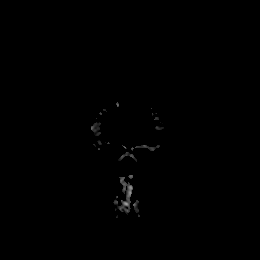
[im 19/37]
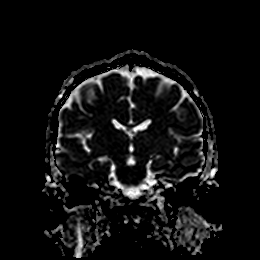
[im 37/37]
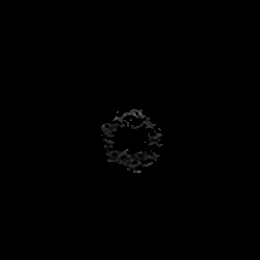

[Series 13: FLAIR · axial · 5.0mm · 0.45mm/px · z∈[-155,-6]mm · 2 of 26 slices shown]
[im 1/26]
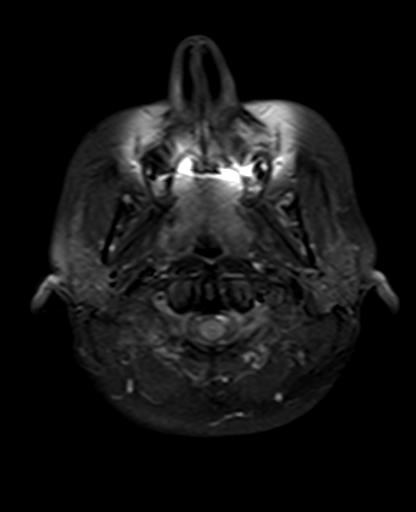
[im 26/26]
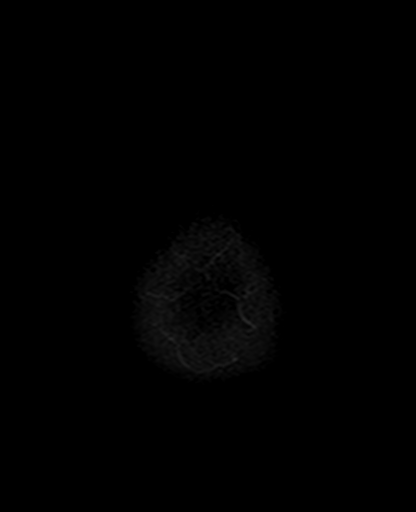

[Series 14: mag_images · axial · 3.0mm · 0.90mm/px · z∈[-156,-4]mm · 4 of 52 slices shown]
[im 1/52]
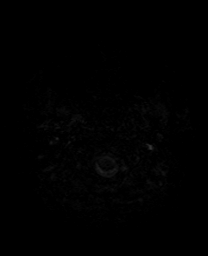
[im 18/52]
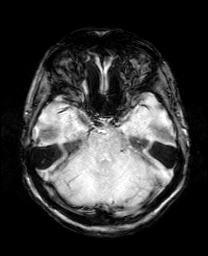
[im 35/52]
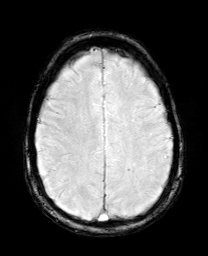
[im 52/52]
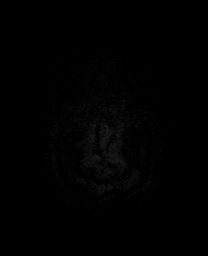

[Series 15: pha_images · axial · 3.0mm · 0.90mm/px · z∈[-156,-4]mm · 4 of 52 slices shown]
[im 1/52]
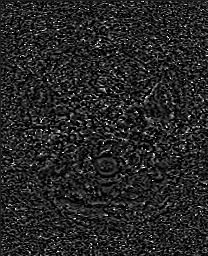
[im 18/52]
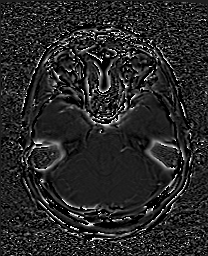
[im 35/52]
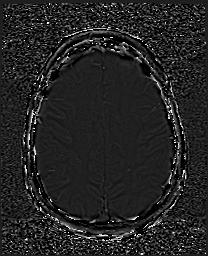
[im 52/52]
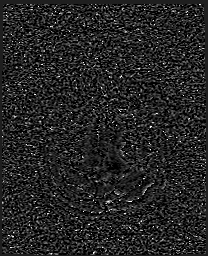

[Series 16: swi_images · axial · 3.0mm · 0.90mm/px · z∈[-156,-4]mm · 4 of 52 slices shown]
[im 1/52]
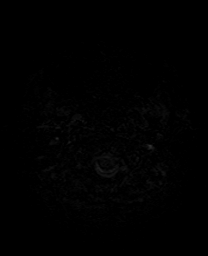
[im 18/52]
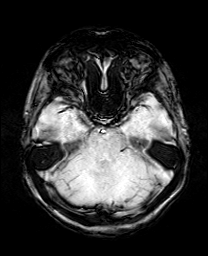
[im 35/52]
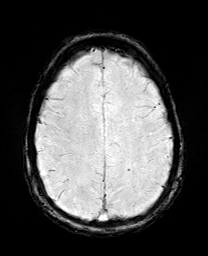
[im 52/52]
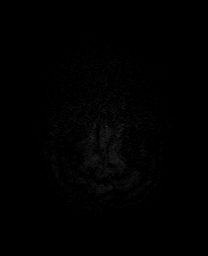

[Series 17: mip_images(sw) · axial · 24.0mm · 0.90mm/px · z∈[-146,-15]mm · 3 of 45 slices shown]
[im 1/45]
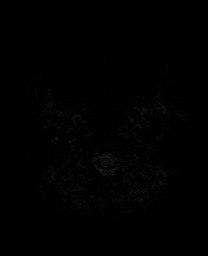
[im 23/45]
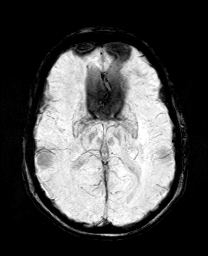
[im 45/45]
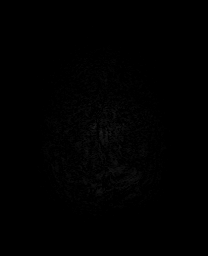

[Series 18: T1 · sagittal · 5.0mm · 0.75mm/px · 2 of 25 slices shown]
[im 1/25]
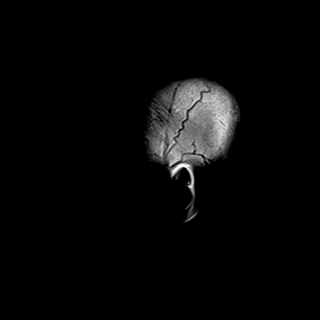
[im 25/25]
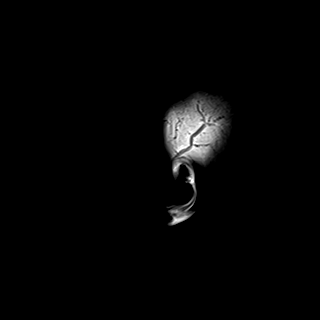

[Series 19: T2 · axial · 5.0mm · 0.72mm/px · z∈[-155,-6]mm · 2 of 26 slices shown (1 of 2)]
[im 1/26]
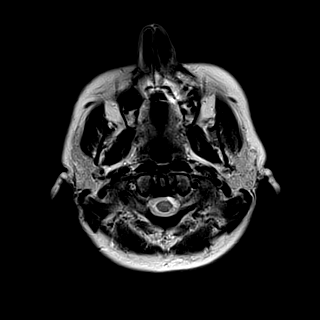
[im 26/26]
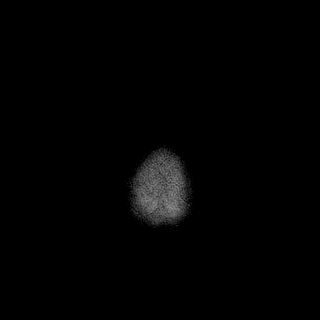

[Series 21: T2 · coronal · 5.0mm · 0.34mm/px · 2 of 31 slices shown (2 of 2)]
[im 1/31]
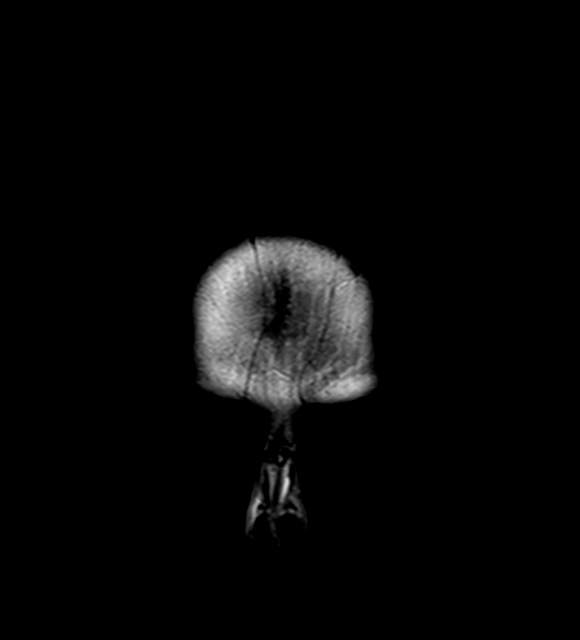
[im 31/31]
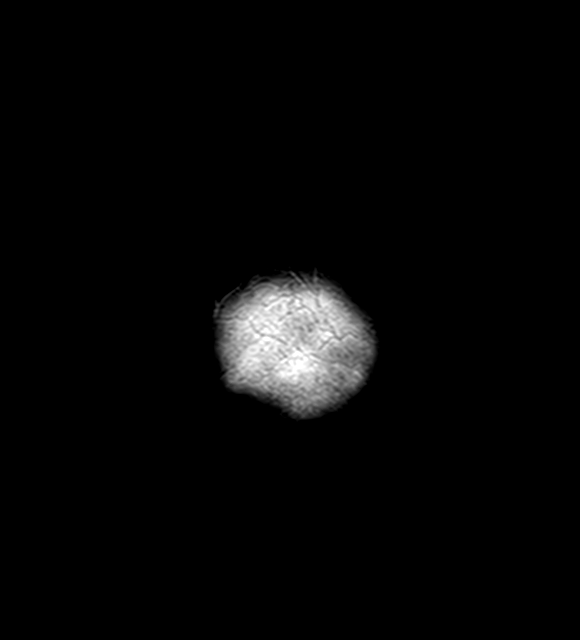

[44 of 48 positions shown; findings below may reference images not displayed]

FINDINGS: BRAIN: There is no acute infarct, acute hemorrhage or extra-axial
collection. Scattered foci of hyperintense T2-weighted signal within
the white matter in a nonspecific pattern that may be seen in the
context of migraine headaches, but is also seen in asymptomatic
patients. The distribution is unchanged. The cerebral and cerebellar
volume are age-appropriate. There is no hydrocephalus. The midline
structures are normal.

VASCULAR: The major intracranial arterial and venous sinus flow
voids are normal. There is a single focus of chronic microhemorrhage
in the left parietal lobe, unchanged.

SKULL AND UPPER CERVICAL SPINE: Calvarial bone marrow signal is
normal. There is no skull base mass. The visualized upper cervical
spine and soft tissues are normal.

SINUSES/ORBITS: There are no fluid levels or advanced mucosal
thickening. The mastoid air cells and middle ear cavities are free
of fluid. The orbits are normal.
IMPRESSION: 1. No acute intracranial abnormality.
2. Unchanged scattered foci of hyperintense T2-weighted signal
within the white matter. These are nonspecific and may be seen in
the setting of migraine headaches, but are also seen in asymptomatic
patients.

## 2020-09-15 IMAGING — CT CT HEAD W/O CM
4 series · 16 of 47 positions shown, 18 images · non-contrast
Comparison: No priors.

CLINICAL DATA: 48-year-old female with history of right-sided
facial numbness and difficulty moving her right arm early this
morning.

EXAM:
CT HEAD WITHOUT CONTRAST
TECHNIQUE: Contiguous axial images were obtained from the base of the skull
through the vertex without intravenous contrast.

[Series 3: head without · axial · non-contrast · 0.41mm/px · z∈[-142,-22]mm · 7 of 32 slices shown, 9 images]
[im 4/32  brain]
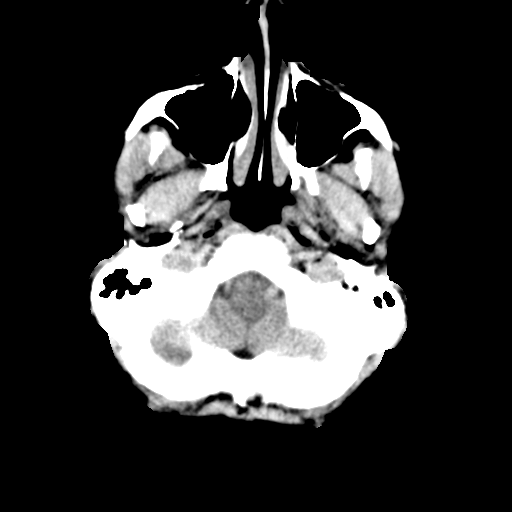
[im 4/32  bone]
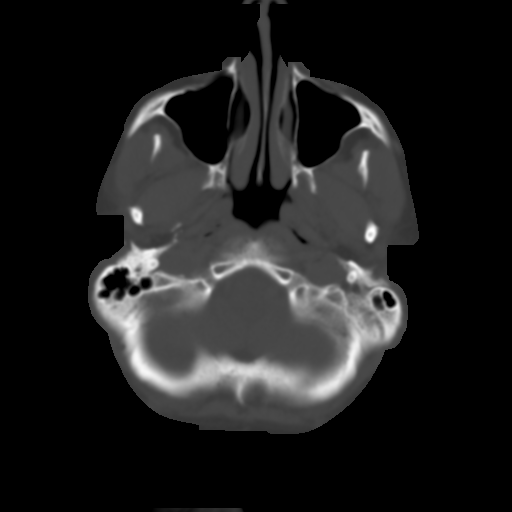
[im 8/32  brain]
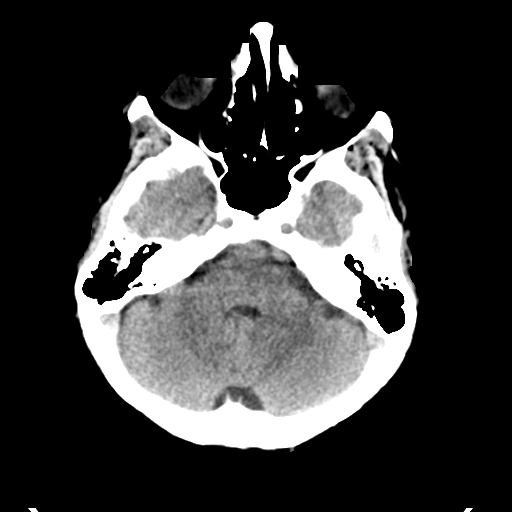
[im 12/32  brain]
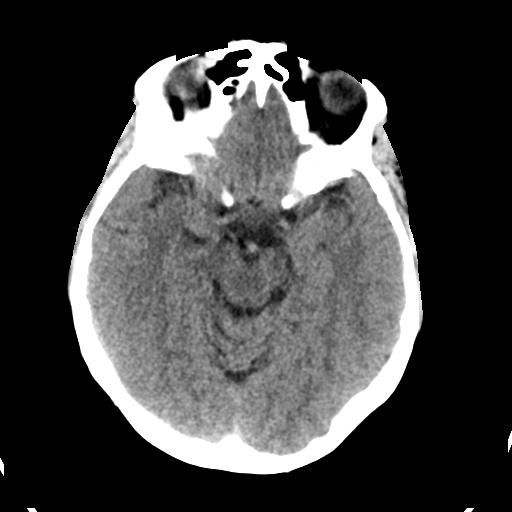
[im 16/32  brain]
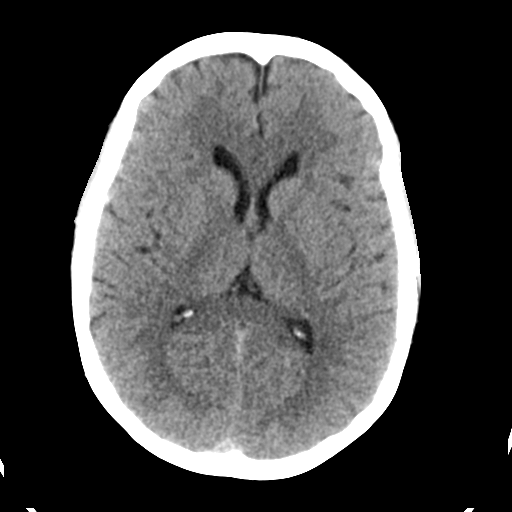
[im 20/32  brain]
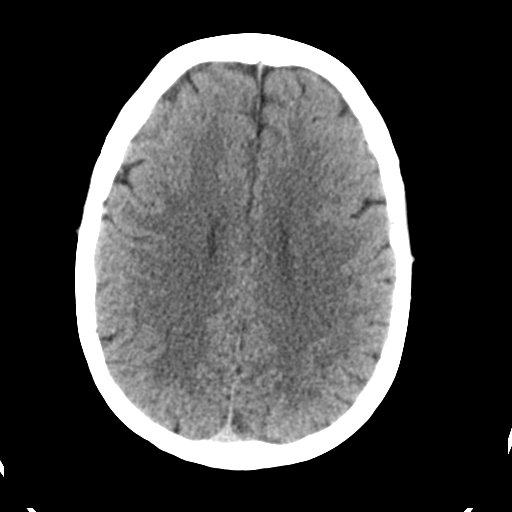
[im 20/32  bone]
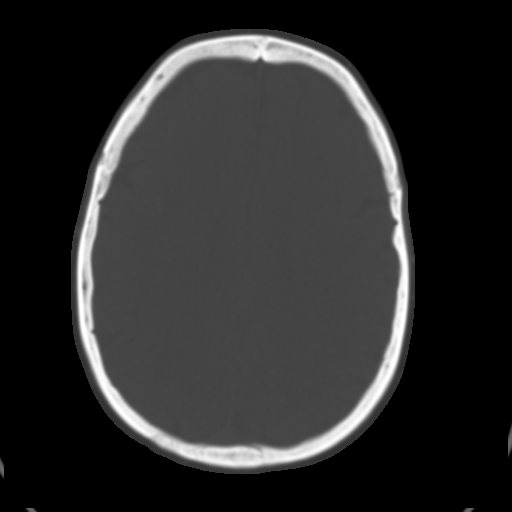
[im 24/32  brain]
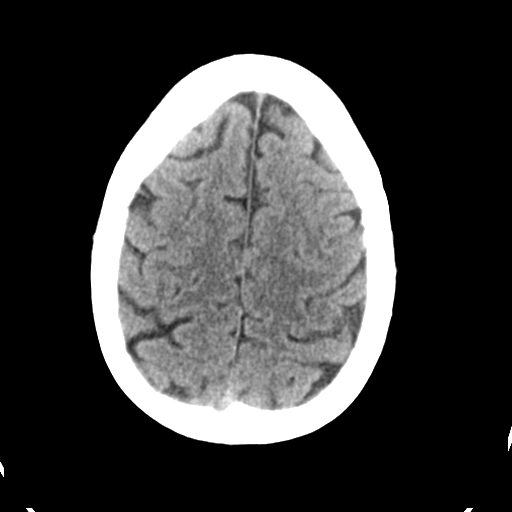
[im 28/32  brain]
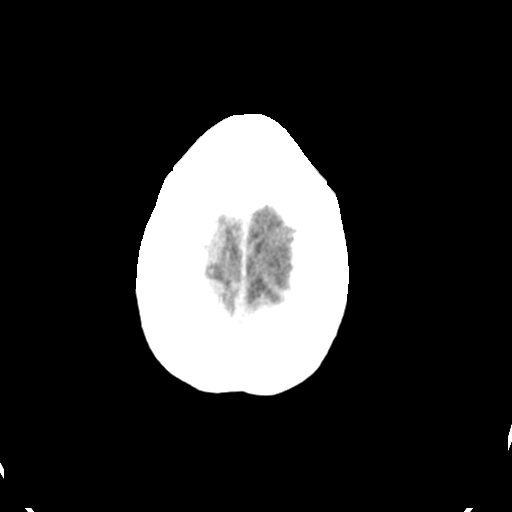

[Series 4: head bone · axial · 0.41mm/px · z∈[-142,-110]mm · 3 of 80 slices shown]
[im 8/80  bone]
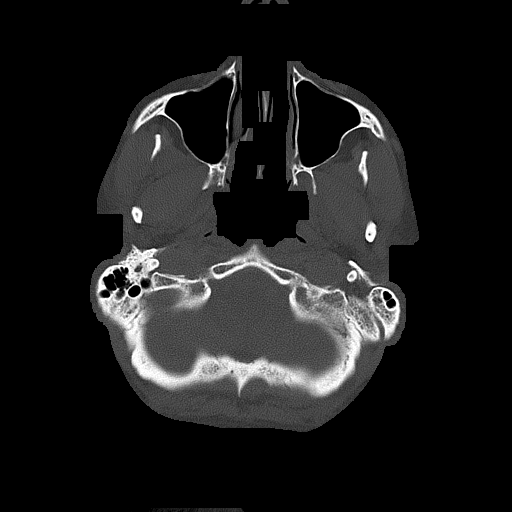
[im 16/80  bone]
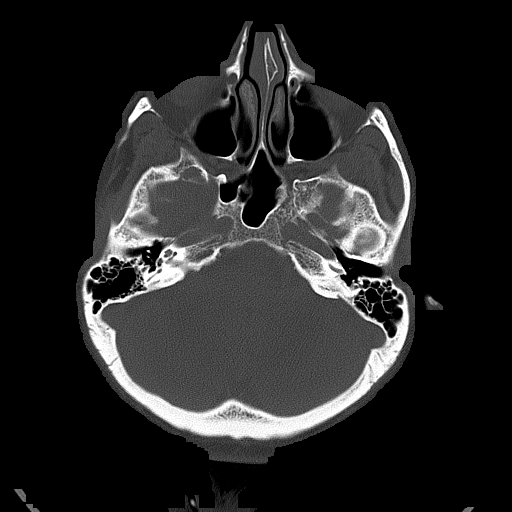
[im 24/80  bone]
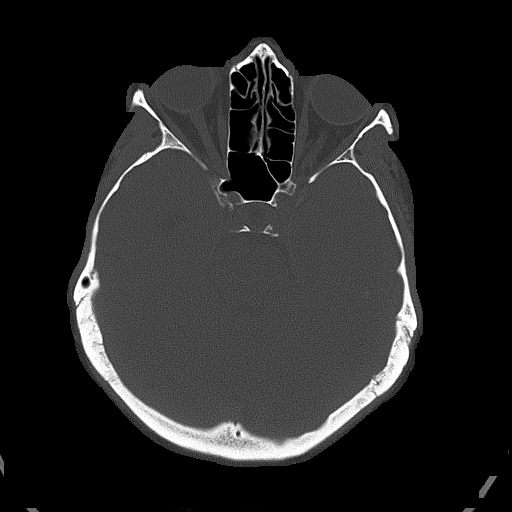

[Series 5: head without cor · coronal · non-contrast · 0.31mm/px · 3 of 67 slices shown]
[im 23/67  brain]
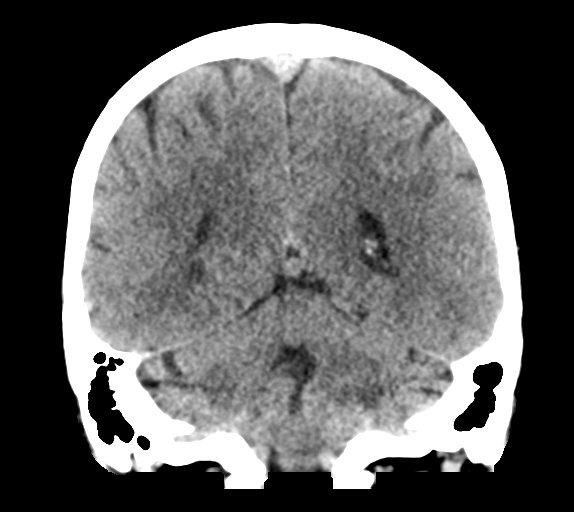
[im 30/67  brain]
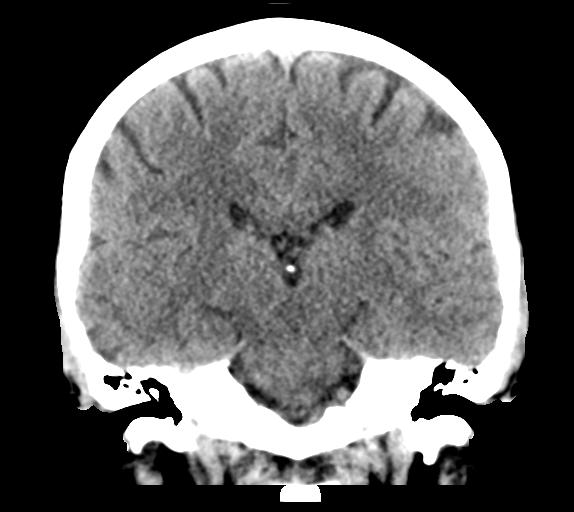
[im 37/67  brain]
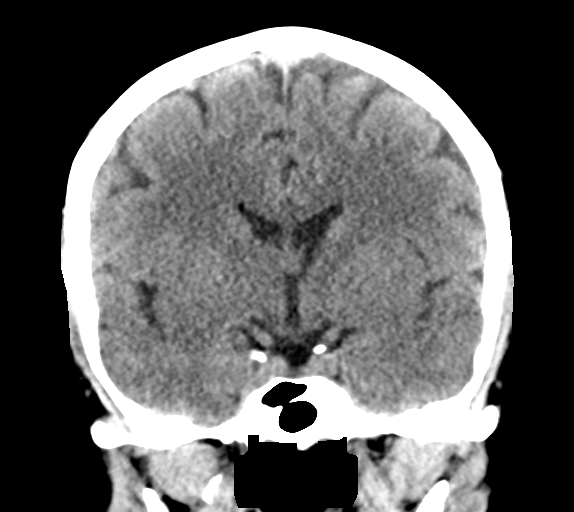

[Series 6: head without sag · sagittal · non-contrast · 0.31mm/px · 3 of 59 slices shown]
[im 20/59  brain]
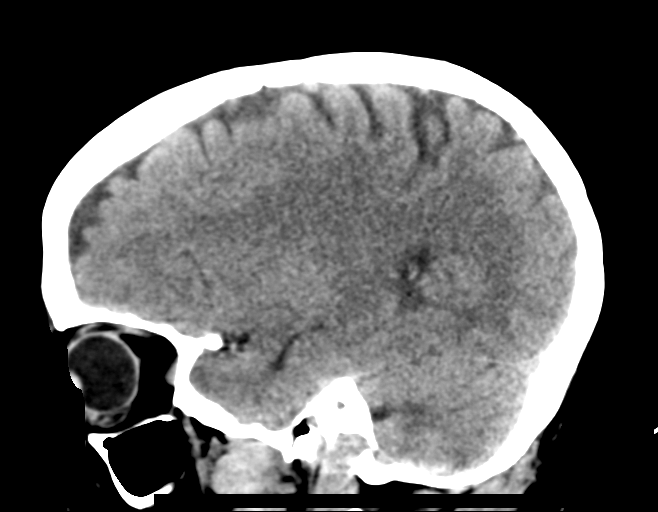
[im 30/59  brain]
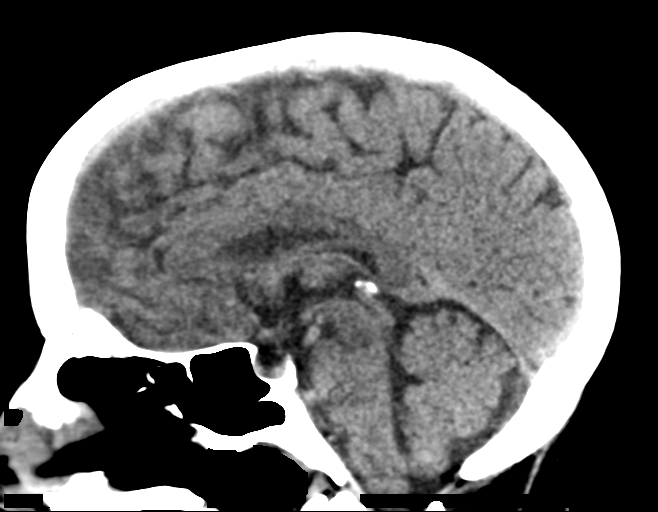
[im 39/59  brain]
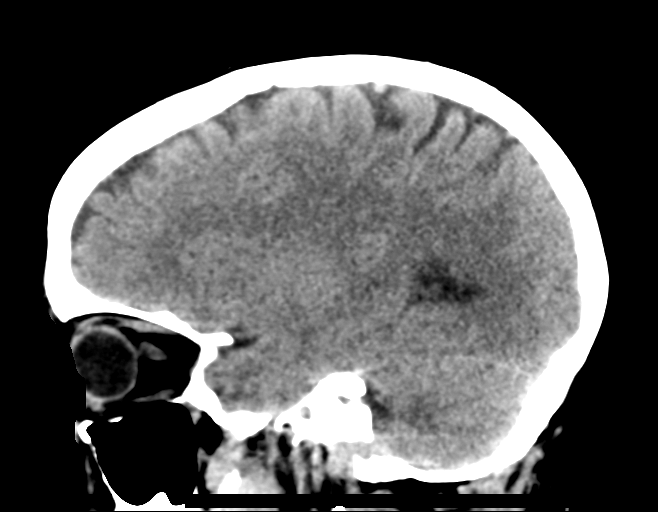

[16 of 47 positions shown; findings below may reference images not displayed]

FINDINGS: Brain: No evidence of acute infarction, hemorrhage, hydrocephalus,
extra-axial collection or mass lesion/mass effect.

Vascular: No hyperdense vessel or unexpected calcification.

Skull: Normal. Negative for fracture or focal lesion.

Sinuses/Orbits: No acute finding.

Other: None.
IMPRESSION: 1. No acute intracranial abnormalities. The appearance of the brain
is normal.
